# Patient Record
Sex: Female | Born: 1980 | Race: Black or African American | Hispanic: No | Marital: Single | Smoking: Never smoker
Health system: Southern US, Community
[De-identification: ages and names within clinical notes are randomized; demographics above are authoritative.]

## PROBLEM LIST (undated history)

## (undated) ENCOUNTER — Inpatient Hospital Stay (HOSPITAL_COMMUNITY): Payer: Self-pay

## (undated) DIAGNOSIS — N390 Urinary tract infection, site not specified: Secondary | ICD-10-CM

## (undated) DIAGNOSIS — D219 Benign neoplasm of connective and other soft tissue, unspecified: Secondary | ICD-10-CM

## (undated) DIAGNOSIS — R51 Headache: Secondary | ICD-10-CM

## (undated) DIAGNOSIS — D649 Anemia, unspecified: Secondary | ICD-10-CM

## (undated) HISTORY — DX: Headache: R51

## (undated) HISTORY — DX: Benign neoplasm of connective and other soft tissue, unspecified: D21.9

## (undated) HISTORY — DX: Anemia, unspecified: D64.9

## (undated) HISTORY — DX: Urinary tract infection, site not specified: N39.0

---

## 2011-03-24 DIAGNOSIS — N390 Urinary tract infection, site not specified: Secondary | ICD-10-CM

## 2011-03-24 HISTORY — DX: Urinary tract infection, site not specified: N39.0

## 2011-04-10 ENCOUNTER — Other Ambulatory Visit (HOSPITAL_COMMUNITY)
Admission: RE | Admit: 2011-04-10 | Discharge: 2011-04-10 | Disposition: A | Discharge status: Home / Self Care | Payer: Medicaid Other | Source: Ambulatory Visit | Attending: Obstetrics & Gynecology | Admitting: Obstetrics & Gynecology

## 2011-04-10 ENCOUNTER — Other Ambulatory Visit: Payer: Self-pay | Admitting: Obstetrics & Gynecology

## 2011-04-10 ENCOUNTER — Encounter (HOSPITAL_COMMUNITY): Payer: Self-pay | Admitting: Obstetrics & Gynecology

## 2011-04-10 DIAGNOSIS — Z1159 Encounter for screening for other viral diseases: Secondary | ICD-10-CM | POA: Insufficient documentation

## 2011-04-10 DIAGNOSIS — Z113 Encounter for screening for infections with a predominantly sexual mode of transmission: Secondary | ICD-10-CM | POA: Insufficient documentation

## 2011-04-10 DIAGNOSIS — Z124 Encounter for screening for malignant neoplasm of cervix: Secondary | ICD-10-CM | POA: Insufficient documentation

## 2011-04-13 ENCOUNTER — Encounter (HOSPITAL_COMMUNITY): Payer: Self-pay

## 2011-04-15 ENCOUNTER — Ambulatory Visit (HOSPITAL_COMMUNITY)
Admission: RE | Admit: 2011-04-15 | Discharge: 2011-04-15 | Disposition: A | Discharge status: Home / Self Care | Payer: Medicaid Other | Source: Ambulatory Visit | Attending: Obstetrics and Gynecology | Admitting: Obstetrics and Gynecology

## 2011-04-15 ENCOUNTER — Encounter (HOSPITAL_COMMUNITY): Payer: Self-pay | Admitting: Anesthesiology

## 2011-04-15 ENCOUNTER — Ambulatory Visit (HOSPITAL_COMMUNITY): Payer: Medicaid Other | Admitting: Anesthesiology

## 2011-04-15 ENCOUNTER — Encounter (HOSPITAL_COMMUNITY)
Admission: RE | Disposition: A | Discharge status: Home / Self Care | Payer: Self-pay | Source: Ambulatory Visit | Attending: Obstetrics and Gynecology

## 2011-04-15 ENCOUNTER — Other Ambulatory Visit: Payer: Self-pay | Admitting: Obstetrics & Gynecology

## 2011-04-15 ENCOUNTER — Other Ambulatory Visit: Payer: Self-pay | Admitting: Obstetrics and Gynecology

## 2011-04-15 DIAGNOSIS — O021 Missed abortion: Secondary | ICD-10-CM | POA: Insufficient documentation

## 2011-04-15 HISTORY — PX: DILATION AND EVACUATION: SHX1459

## 2011-04-15 LAB — CBC
MCHC: 32.2 g/dL (ref 30.0–36.0)
Platelets: 257 10*3/uL (ref 150–400)
RDW: 13.5 % (ref 11.5–15.5)
WBC: 6.3 10*3/uL (ref 4.0–10.5)

## 2011-04-15 SURGERY — DILATION AND EVACUATION, UTERUS
Anesthesia: General | Site: Uterus | Wound class: Clean Contaminated

## 2011-04-15 MED ORDER — SODIUM CHLORIDE 0.9 % IJ SOLN: 3.0000 mL | Freq: Two times a day (BID) | INTRAMUSCULAR | Status: DC

## 2011-04-15 MED ORDER — KETOROLAC TROMETHAMINE 30 MG/ML IJ SOLN
INTRAMUSCULAR | Status: AC
  Filled 2011-04-15: qty 1

## 2011-04-15 MED ORDER — MIDAZOLAM HCL 2 MG/2ML IJ SOLN
INTRAMUSCULAR | Status: AC
  Filled 2011-04-15: qty 2

## 2011-04-15 MED ORDER — FENTANYL CITRATE 0.05 MG/ML IJ SOLN
INTRAMUSCULAR | Status: DC | PRN
  Administered 2011-04-15: 25 ug via INTRAVENOUS
  Administered 2011-04-15: 50 ug via INTRAVENOUS
  Administered 2011-04-15: 25 ug via INTRAVENOUS

## 2011-04-15 MED ORDER — KETOROLAC TROMETHAMINE 30 MG/ML IJ SOLN: 15.0000 mg | Freq: Once | INTRAMUSCULAR | Status: DC | PRN

## 2011-04-15 MED ORDER — MISOPROSTOL 200 MCG PO TABS
ORAL_TABLET | ORAL | Status: AC
  Filled 2011-04-15: qty 1

## 2011-04-15 MED ORDER — SODIUM CHLORIDE 0.9 % IJ SOLN: 3.0000 mL | INTRAMUSCULAR | Status: DC | PRN

## 2011-04-15 MED ORDER — KETOROLAC TROMETHAMINE 30 MG/ML IJ SOLN
INTRAMUSCULAR | Status: DC | PRN
  Administered 2011-04-15: 30 mg via INTRAVENOUS

## 2011-04-15 MED ORDER — LIDOCAINE HCL (CARDIAC) 20 MG/ML IV SOLN
INTRAVENOUS | Status: AC
  Filled 2011-04-15: qty 5

## 2011-04-15 MED ORDER — MEPERIDINE HCL 25 MG/ML IJ SOLN
6.2500 mg | INTRAMUSCULAR | Status: DC | PRN
Start: 2011-04-15 — End: 2011-04-15

## 2011-04-15 MED ORDER — FENTANYL CITRATE 0.05 MG/ML IJ SOLN
INTRAMUSCULAR | Status: AC
  Filled 2011-04-15: qty 2

## 2011-04-15 MED ORDER — LIDOCAINE HCL (CARDIAC) 20 MG/ML IV SOLN
INTRAVENOUS | Status: DC | PRN
  Administered 2011-04-15: 40 mg via INTRAVENOUS

## 2011-04-15 MED ORDER — LACTATED RINGERS IV SOLN
INTRAVENOUS | Status: DC
  Administered 2011-04-15 (×2): via INTRAVENOUS

## 2011-04-15 MED ORDER — PROMETHAZINE HCL 25 MG/ML IJ SOLN: 6.2500 mg | INTRAMUSCULAR | Status: DC | PRN

## 2011-04-15 MED ORDER — HYDROCODONE-ACETAMINOPHEN 5-325 MG PO TABS: 1.0000 | ORAL_TABLET | ORAL | Status: DC | PRN

## 2011-04-15 MED ORDER — ONDANSETRON HCL 4 MG/2ML IJ SOLN
INTRAMUSCULAR | Status: DC | PRN
  Administered 2011-04-15: 4 mg via INTRAVENOUS

## 2011-04-15 MED ORDER — ONDANSETRON HCL 4 MG/2ML IJ SOLN
INTRAMUSCULAR | Status: AC
  Filled 2011-04-15: qty 2

## 2011-04-15 MED ORDER — SODIUM CHLORIDE 0.9 % IV SOLN: 250.0000 mL | INTRAVENOUS | Status: DC | PRN

## 2011-04-15 MED ORDER — EPHEDRINE SULFATE 50 MG/ML IJ SOLN
INTRAMUSCULAR | Status: DC | PRN
  Administered 2011-04-15 (×2): 5 mg via INTRAVENOUS

## 2011-04-15 MED ORDER — LIDOCAINE HCL 2 % IJ SOLN
INTRAMUSCULAR | Status: AC
  Filled 2011-04-15: qty 1

## 2011-04-15 MED ORDER — MIDAZOLAM HCL 5 MG/5ML IJ SOLN
INTRAMUSCULAR | Status: DC | PRN
  Administered 2011-04-15: 2 mg via INTRAVENOUS

## 2011-04-15 MED ORDER — PROPOFOL 10 MG/ML IV EMUL
INTRAVENOUS | Status: DC | PRN
  Administered 2011-04-15: 140 mg via INTRAVENOUS

## 2011-04-15 MED ORDER — FENTANYL CITRATE 0.05 MG/ML IJ SOLN: 25.0000 ug | INTRAMUSCULAR | Status: DC | PRN

## 2011-04-15 MED ORDER — SILVER NITRATE-POT NITRATE 75-25 % EX MISC
CUTANEOUS | Status: AC
  Filled 2011-04-15: qty 1

## 2011-04-15 MED ORDER — PROPOFOL 10 MG/ML IV EMUL
INTRAVENOUS | Status: AC
  Filled 2011-04-15: qty 20

## 2011-04-15 SURGICAL SUPPLY — 17 items
CATH ROBINSON RED A/P 16FR (CATHETERS) IMPLANT
CLOTH BEACON ORANGE TIMEOUT ST (SAFETY) ×2 IMPLANT
DECANTER SPIKE VIAL GLASS SM (MISCELLANEOUS) ×2 IMPLANT
GLOVE BIO SURGEON STRL SZ7.5 (GLOVE) ×2 IMPLANT
GLOVE BIOGEL PI IND STRL 7.5 (GLOVE) ×1 IMPLANT
GLOVE BIOGEL PI INDICATOR 7.5 (GLOVE) ×1
GOWN PREVENTION PLUS LG XLONG (DISPOSABLE) ×2 IMPLANT
GOWN PREVENTION PLUS XLARGE (GOWN DISPOSABLE) ×2 IMPLANT
KIT BERKELEY 1ST TRIMESTER 3/8 (MISCELLANEOUS) ×2 IMPLANT
NEEDLE SPNL 22GX3.5 QUINCKE BK (NEEDLE) ×2 IMPLANT
NS IRRIG 1000ML POUR BTL (IV SOLUTION) ×2 IMPLANT
PACK VAGINAL MINOR WOMEN LF (CUSTOM PROCEDURE TRAY) ×2 IMPLANT
PAD PREP 24X48 CUFFED NSTRL (MISCELLANEOUS) ×2 IMPLANT
SET BERKELEY SUCTION TUBING (SUCTIONS) ×2 IMPLANT
SYR CONTROL 10ML LL (SYRINGE) ×2 IMPLANT
TOWEL OR 17X24 6PK STRL BLUE (TOWEL DISPOSABLE) ×4 IMPLANT
VACURETTE 10 RIGID CVD (CANNULA) ×2 IMPLANT

## 2011-04-15 NOTE — Transfer of Care (Signed)
Immediate Anesthesia Transfer of Care Note  Patient: Sherri Galvan  Procedure(s) Performed:  DILATATION AND EVACUATION - 12wks: Performed by Dr. Filbert Berthold  Patient Location: PACU  Anesthesia Type: General  Level of Consciousness: awake, alert  and oriented  Airway & Oxygen Therapy: Patient Spontanous Breathing and Patient connected to nasal cannula oxygen  Post-op Assessment: Report given to PACU RN and Post -op Vital signs reviewed and stable  Post vital signs: Reviewed and stable  Complications: No apparent anesthesia complications

## 2011-04-15 NOTE — Op Note (Addendum)
Sherri Galvan PROCEDURE DATE: 04/15/2011  PREOPERATIVE DIAGNOSIS: 12 week missed abortion. POSTOPERATIVE DIAGNOSIS: The same. PROCEDURE:     Dilation and Evacuation. SURGEON:  Dr. Waverly Ferrari. [name]  INDICATIONS: 31 y.o. G4P0013with MAB at **1* weeks gestation, needing surgical completion.  Risks of surgery were discussed with the patient including but not limited to: bleeding which may require transfusion; infection which may require antibiotics; injury to uterus or surrounding organs;need for additional procedures including laparotomy or laparoscopy; possibility of intrauterine scarring which may impair future fertility; and other postoperative/anesthesia complications. Written informed consent was obtained.    FINDINGS:  A 12 size anteverted/midline/retroverted uterus, moderate amounts of products of conception, specimen sent to pathology.  ANESTHESIA:    Monitored intravenous sedation, paracervical block. INTRAVENOUS FLUIDS:  1200 ml of LR ESTIMATED BLOOD LOSS:  Less than 150 ml. SPECIMENS:  Products of conception sent to pathology COMPLICATIONS:  None immediate.  PROCEDURE DETAILS:  The patient and significant other [name] were met in the short stay area.  She was then taken to the operating room where general anesthesia was administered and was found to be adequate.  After an adequate timeout was performed, she was placed in the dorsal lithotomy position and examined; then prepped and draped in the sterile manner.   A vaginal speculum was then placed in the patient's vagina and a single tooth tenaculum was applied to the anterior lip of the cervix.  The cervix was gently dilated to to a 31 [place] dilator.  A size 10 suction curette that was gently advanced to the uterine fundus after confirming that the the maximum of negative pressure was 60 cm mercury.  The suction device was then activated and curette slowly rotated to clear the uterus of products of conception.  A sharp curettage was then  performed to confirm complete emptying of the uterus. There was minimal bleeding noted.  The suction device was reintroduced and in a similar fashion and the tenaculum removed with good hemostasis noted.   All instruments were removed from the patient's vagina. The patient tolerated the procedure well and was taken to the recovery area awake, and in stable condition.  The patient will be discharged to home as per PACU criteria.  Routine postoperative instructions given.  She was prescribed , Ibuprofen.  She will follow up in the clinic on in 2 weeks for postoperative evaluation.

## 2011-04-15 NOTE — Anesthesia Postprocedure Evaluation (Signed)
Anesthesia Post Note  Patient: Sherri Galvan  Procedure(s) Performed:  DILATATION AND EVACUATION - 12wks: Performed by Dr. Filbert Berthold  Anesthesia type: General  Patient location: PACU  Post pain: Pain level controlled  Post assessment: Post-op Vital signs reviewed  Last Vitals:  Filed Vitals:   04/15/11 1046  BP: 96/64  Pulse: 65  Temp: 36.9 C  Resp: 16    Post vital signs: Reviewed  Level of consciousness: sedated  Complications: No apparent anesthesia complicationsfj

## 2011-04-15 NOTE — H&P (Addendum)
Sherri Galvan is an 31 y.o. female. [id number] with the current missed ab ABO Bpositive with an [place] at [facility] with an IUP on 10JAN13 with CRL consistent with [redacted]w[redacted]d and presented to the office with one day h/o pelvic cramping and no fetal doptones were discovered and the Korea confirmed no fetal cardiac activity at 12w1 by CRL.  Myomas were noted on both [place] with the largest reportedly 6cm.   Pertinent Gynecological History: Menses: flow is moderate and LMP was 22OCT12 Bleeding: none Contraception: none DES exposure: denies Blood transfusions: none Sexually transmitted diseases: no past history Previous GYN Procedures: none  Last mammogram: none Date: none Last pap: 4/09 normal pap pending Date: 4/09 OB History: G4, P3013   Menstrual History: Menarche age: wnl No LMP recorded.[id number]    Past Medical History  Diagnosis Date  . UTI (lower urinary tract infection) 03-24-11    finishing macrobid  . Headache   . Fibroid     No past surgical history on file.none  Family History  Problem Relation Age of Onset  . Breast cancer Maternal Grandmother     maternal grandmother  . Cancer Maternal Grandmother     Social History:  reports that she has never smoked. She does not have any smokeless tobacco history on file. She reports that she does not drink alcohol or use illicit drugs.  Allergies: No Known Allergies  No prescriptions prior to admission    Review of Systems  Constitutional: Negative.   HENT: Negative for ear pain, congestion, sore throat, neck pain and tinnitus.   Eyes: Negative for blurred vision.  Respiratory: Negative for cough, shortness of breath and wheezing.   Cardiovascular: Negative for chest pain.  Gastrointestinal: Positive for constipation. Negative for heartburn, nausea, vomiting, diarrhea and blood in stool.  Genitourinary: Negative for dysuria and hematuria.  Musculoskeletal: Negative for joint pain.  Skin: Negative.   Neurological: Positive for headaches.  Negative for dizziness.  Endo/Heme/Allergies: Negative.   Psychiatric/Behavioral: Negative for depression.  admits to HA and constipation and some pelvic cramping  Denies nausea and emesis fever chill dysuria and diarrhea hematuria and hematochezia chest pain or shortness of breath cough hoint pain or tinnitus  unknown if currently breastfeeding. Physical Exam  Constitutional: She is oriented to person, place, and time. She appears well-developed and well-nourished.  HENT:  Head: Normocephalic and atraumatic.  Mouth/Throat: Oropharynx is clear and moist.  Eyes: Pupils are equal, round, and reactive to light.  Neck: Normal range of motion. Neck supple. No thyromegaly present.  Cardiovascular: Normal rate and normal heart sounds.   Respiratory: Effort normal and breath sounds normal. She has no wheezes. She has no rales.  GI: Soft. She exhibits no distension. There is no tenderness. There is no rebound and no guarding.  Genitourinary: Vagina normal.  Musculoskeletal: Normal range of motion. She exhibits no edema.  Neurological: She is alert and oriented to person, place, and time.  Skin: Skin is warm.  Psychiatric: She has a normal mood and affect. Her behavior is normal.   VSSAF Gerneral: Well appearing female in NAD HEENT: NCAT with PERRL with OP clear of lesions and no Lymph nodes with full ROM Chest CTAB CV RRR ABD Nontender with midline fundus noted just above symphysis EXT non tender full ROM and normal strength  GYN: EXT genitalia WNL Vagina wnl with slight mucoid discharge Cervix without lesions parous Uterus about12-14 week size Adnexa without masses or tenderness    No results found for this or any previous  visit (from the past 24 hour(s)).  No results found.  Assessment/Plan: Missed abat 12 weeks B positive Fibroids UTI treated macrobid completed Constipation     Suction D&C   And do not anticipate the need for blood products  Elbony Mcclimans  H. 04/15/2011, 9:37 AM

## 2011-04-15 NOTE — OR Nursing (Signed)
Case scheduled under Dr. Neva Seat, partner Dr. Filbert Berthold performed the procedure.  Dr. Christell Constant currently not in the [facility] system for charging purposes.

## 2011-04-15 NOTE — Anesthesia Preprocedure Evaluation (Signed)
Anesthesia Evaluation  Patient identified by MRN, date of birth, ID band Patient awake    Reviewed: Allergy & Precautions, H&P , NPO status , Patient's Chart, lab work & pertinent test results  Airway Mallampati: I TM Distance: >3 FB Neck ROM: full    Dental No notable dental hx.    Pulmonary neg pulmonary ROS,    Pulmonary exam normal       Cardiovascular neg cardio ROS     Neuro/Psych Negative Psych ROS   GI/Hepatic negative GI ROS, Neg liver ROS,   Endo/Other  Negative Endocrine ROS  Renal/GU negative Renal ROS  Genitourinary negative   Musculoskeletal negative musculoskeletal ROS (+)   Abdominal Normal abdominal exam  (+)   Peds negative pediatric ROS (+)  Hematology negative hematology ROS (+)   Anesthesia Other Findings   Reproductive/Obstetrics (+) Pregnancy 12 week missed Ab                           Anesthesia Physical Anesthesia Plan  ASA: II  Anesthesia Plan: General   Post-op Pain Management:    Induction: Intravenous  Airway Management Planned: LMA  Additional Equipment:   Intra-op Plan:   Post-operative Plan:   Informed Consent: I have reviewed the patients History and Physical, chart, labs and discussed the procedure including the risks, benefits and alternatives for the proposed anesthesia with the patient or authorized representative who has indicated his/her understanding and acceptance.     Plan Discussed with: CRNA  Anesthesia Plan Comments:         Anesthesia Quick Evaluation

## 2011-04-16 ENCOUNTER — Encounter (HOSPITAL_COMMUNITY): Payer: Self-pay | Admitting: Obstetrics and Gynecology

## 2011-06-29 ENCOUNTER — Inpatient Hospital Stay (HOSPITAL_COMMUNITY)
Admission: AD | Admit: 2011-06-29 | Discharge: 2011-06-30 | Disposition: A | Discharge status: Home / Self Care | Payer: Medicaid Other | Source: Ambulatory Visit | Attending: Obstetrics and Gynecology | Admitting: Obstetrics and Gynecology

## 2011-06-29 ENCOUNTER — Encounter (HOSPITAL_COMMUNITY): Payer: Self-pay | Admitting: *Deleted

## 2011-06-29 DIAGNOSIS — O021 Missed abortion: Secondary | ICD-10-CM | POA: Insufficient documentation

## 2011-06-29 DIAGNOSIS — IMO0002 Reserved for concepts with insufficient information to code with codable children: Secondary | ICD-10-CM

## 2011-06-29 NOTE — MAU Note (Signed)
Patient was transferred from [place] for further evaluation.

## 2011-06-29 NOTE — MAU Note (Signed)
Vaginal bleeding since 2/21 after D&C for miscarriage. The following Friday five days later patient states she passed a specimen that looked like a baby with eyes. The bleeding has continued and gotten heavier. Abdominal cramping since 4/15 after what the patient states was her menstrual cycle.

## 2011-06-30 MED ORDER — IBUPROFEN 600 MG PO TABS
600.0000 mg | ORAL_TABLET | Freq: Once | ORAL | Status: AC
  Administered 2011-06-30: 600 mg via ORAL
  Filled 2011-06-30: qty 1

## 2011-06-30 MED ORDER — METHYLERGONOVINE MALEATE 0.2 MG PO TABS
0.2000 mg | ORAL_TABLET | Freq: Once | ORAL | Status: AC
  Administered 2011-06-30: 0.2 mg via ORAL
  Filled 2011-06-30: qty 1

## 2011-06-30 MED ORDER — METHYLERGONOVINE MALEATE 0.2 MG PO TABS: 0.2000 mg | ORAL_TABLET | Freq: Four times a day (QID) | ORAL | Status: DC

## 2011-06-30 MED ORDER — IBUPROFEN 600 MG PO TABS: 600.0000 mg | ORAL_TABLET | Freq: Once | ORAL | Status: AC

## 2011-06-30 NOTE — MAU Provider Note (Cosign Needed)
History   HPI: Sherri Galvan is a 31 y.o. year old G6P0013 female 2 1/2 months S/P D&E for missed abortion who was transported to MAU by EMS from [facility] for severe abdominal pain and bleeding x 2 1/2 months. She had an extensive workup including CBC (WBCs 12.6, Hgb 11.5) and US showing enlarged leiomyomatosis uterus w/ debris and hemorrhage  Within endometrium. There is no obvious retained POC. Pt's pain resolved w/ Morphine. She reports moderate bleeding while at [facility]. She    CSN: [id number]  Arrival date and time: 06/29/11 2239   First Provider Initiated Contact with Patient 06/30/11 0038      Chief Complaint  Patient presents with  . Abdominal Cramping   HPI  OB History    Grav Para Term Preterm Abortions TAB SAB Ect Mult Living   4 3   1     3       Past Medical History  Diagnosis Date  . UTI (lower urinary tract infection) 03-24-11    finishing macrobid  . Headache   . Fibroid     Past Surgical History  Procedure Date  . Dilation and evacuation 04/15/2011    Procedure: DILATATION AND EVACUATION;  Surgeon: Fortino Sic, MD;  Location: [facility];  Service: Gynecology;  Laterality: N/A;  12wks: Performed by Dr. Filbert Berthold    Family History  Problem Relation Age of Onset  . Breast cancer Maternal Grandmother     maternal grandmother  . Cancer Maternal Grandmother     History  Substance Use Topics  . Smoking status: Never Smoker   . Smokeless tobacco: Not on file  . Alcohol Use: No    Allergies: No Known Allergies  Prescriptions prior to admission  Medication Sig Dispense Refill  . acetaminophen (TYLENOL) 500 MG tablet Take 500 mg by mouth every 6 (six) hours as needed. For pain        Review of Systems  Constitutional: Negative for fever and chills.  Gastrointestinal: Positive for nausea and abdominal pain. Negative for vomiting, diarrhea and constipation.  Genitourinary: Negative for dysuria, urgency, frequency, hematuria and flank pain.    Musculoskeletal: Negative for myalgias.  Neurological: Negative for weakness.   Physical Exam  BP 111/74  Pulse 64  Resp 18  LMP 06/17/2011  Breastfeeding? No  Physical Exam  Nursing note and vitals reviewed. Constitutional: She is oriented to person, place, and time. She appears well-developed and well-nourished. No distress.  Cardiovascular: Normal rate.   Respiratory: Effort normal.  GI: Soft. She exhibits no distension and no mass. There is tenderness (mild low abd). There is no rebound and no guarding.  Genitourinary: Uterus is enlarged. Uterus is not tender. Right adnexum displays no mass, no tenderness and no fullness. Left adnexum displays no mass, no tenderness and no fullness. There is bleeding (moderate amount of milky, mildly malodorous serosanguinous discharge in vault. ) around the vagina. No tenderness around the vagina.       Large dark red mass visible at os during spec exam. Removed what appears to be an intact 5x8 cm placenta w/ ring forceps. Pt tolerated well. Moderate gush of bloody discharge immediately after removal of POC, then small amount to follow. Cervix 1-2 cm dilated. Uterus enlarged to 10-11 weeks-size, irreg shape. NT.  Neurological: She is alert and oriented to person, place, and time.  Skin: Skin is warm and dry.  Psychiatric: She has a normal mood and affect.    MAU Course  Procedures  Dr. Richardson Dopp notified of findings. Gave orders to send POC to pathology and to start Methergine series.   Assessment and Plan   1. Retained products of conception     Plan:  D/C home Follow-up Information    Follow up with Fortino Sic, MD in 1 week. (or MAU as needed if symptoms worsen)    Contact information:   [address] [place] [address] [phone]         Medication List  As of 06/30/2011  1:03 AM   START taking these medications         ibuprofen 600 MG tablet   Commonly known as: ADVIL,MOTRIN   Take 1 tablet (600 mg  total) by mouth once.      methylergonovine 0.2 MG tablet   Commonly known as: METHERGINE   Take 1 tablet (0.2 mg total) by mouth every 6 (six) hours.         CONTINUE taking these medications         acetaminophen 500 MG tablet   Commonly known as: TYLENOL          Where to get your medications    These are the prescriptions that you need to pick up.   You may get these medications from any pharmacy.         ibuprofen 600 MG tablet   methylergonovine 0.2 MG tablet          Bleeding and infection precautions  Deaven Urwin 06/30/2011, 12:37 AM

## 2011-06-30 NOTE — MAU Note (Signed)
Products collected during pelvic exam sent to Pathology.

## 2011-10-29 LAB — OB RESULTS CONSOLE ABO/RH: RH Type: POSITIVE

## 2011-10-29 LAB — OB RESULTS CONSOLE ANTIBODY SCREEN: Antibody Screen: NEGATIVE

## 2011-10-29 LAB — OB RESULTS CONSOLE HEPATITIS B SURFACE ANTIGEN: Hepatitis B Surface Ag: NEGATIVE

## 2012-02-21 LAB — OB RESULTS CONSOLE HIV ANTIBODY (ROUTINE TESTING): HIV: NONREACTIVE

## 2012-04-28 LAB — OB RESULTS CONSOLE GBS: GBS: NEGATIVE

## 2012-06-02 ENCOUNTER — Other Ambulatory Visit: Payer: Self-pay | Admitting: Obstetrics & Gynecology

## 2012-06-02 NOTE — H&P (Signed)
Sherri Galvan is a 32 y.o. female presenting for post term induction. Maternal Medical History:  Reason for admission: Nausea. 32 yo G5 P3013 at 41w3 on 06-04-12 after EDC of 05-25-12 with 4022g fetus at 86% with BPP of 8/8 on 06-02-12.  She is now being admitted for induction of labor.  She desires a CLE and does not desire elective CSx and understands the increase risk of CSx with induction of labor.  She desires circumcision and also BTL.  She is GBBS negative.    Her pregnancy is complicated by fibroids 6 cm and 4 cm and above the cervix and LUS.    Fetal activity: Perceived fetal activity is normal.    Prenatal complications: no prenatal complications No bleeding, cholelithiasis, HIV, hypertension, infection, IUGR, nephrolithiasis, oligohydramnios, placental abnormality, polyhydramnios, pre-eclampsia, preterm labor, substance abuse, thrombocytopenia or thrombophilia.   Prenatal Complications - Diabetes: none.    OB History   Grav Para Term Preterm Abortions TAB SAB Ect Mult Living   4 3   1     3      Past Medical History  Diagnosis Date  . UTI (lower urinary tract infection) 03-24-11    finishing macrobid  . Headache   . Fibroid    Past Surgical History  Procedure Laterality Date  . Dilation and evacuation  04/15/2011    Procedure: DILATATION AND EVACUATION;  Surgeon: Fortino Sic, MD;  Location: [facility];  Service: Gynecology;  Laterality: N/A;  12wks: Performed by Dr. Filbert Berthold   Family History: family history includes Breast cancer in her maternal grandmother and Cancer in her maternal grandmother. Social History:  reports that she has never smoked. She does not have any smokeless tobacco history on file. She reports that she does not drink alcohol or use illicit drugs.   Prenatal Transfer Tool  Maternal Diabetes: No Genetic Screening: Normal NT/Early screen negative and AFP negative Maternal Ultrasounds/Referrals: Normal Fetal Ultrasounds or other Referrals:   None Maternal Substance Abuse:  No Significant Maternal Medications:  None Significant Maternal Lab Results:  None Other Comments:  None  Review of Systems  Constitutional: Negative.   HENT: Negative.  Negative for hearing loss, ear pain, sore throat and tinnitus.   Eyes: Negative.  Negative for blurred vision, double vision, photophobia and discharge.  Respiratory: Negative.  Negative for cough, sputum production, shortness of breath and wheezing.   Cardiovascular: Negative.  Negative for chest pain, palpitations, orthopnea and leg swelling.  Gastrointestinal: Negative.  Negative for nausea, vomiting, diarrhea and constipation.  Genitourinary: Negative.  Negative for dysuria, hematuria and flank pain.  Musculoskeletal: Negative.  Negative for myalgias.  Skin: Negative.  Negative for rash.  Neurological: Negative.  Negative for dizziness, tremors, speech change, focal weakness, seizures, loss of consciousness and headaches.  Endo/Heme/Allergies: Negative.  Does not bruise/bleed easily.  Psychiatric/Behavioral: Negative.  Negative for depression, suicidal ideas, hallucinations and substance abuse. The patient is not nervous/anxious.       not currently breastfeeding. Maternal Exam:  Abdomen: Patient reports no abdominal tenderness. Estimated fetal weight is 8#14.   Fetal presentation: vertex  Introitus: Normal vagina.  Evaluated at last visit  Pelvis: adequate for delivery.   Cervix: Cervix evaluated by digital exam.   05-29-12  Physical Exam  Constitutional: She is oriented to person, place, and time. She appears well-developed and well-nourished.  HENT:  Head: Normocephalic and atraumatic.  Right Ear: External ear normal.  Left Ear: External ear normal.  Mouth/Throat: No oropharyngeal exudate.  Eyes: EOM are  normal. Pupils are equal, round, and reactive to light. Right eye exhibits no discharge. Left eye exhibits no discharge.  Neck: Normal range of motion. Neck supple. No  tracheal deviation present. No thyromegaly present.  Cardiovascular: Normal rate, regular rhythm and normal heart sounds.   Respiratory: Effort normal and breath sounds normal. She has no wheezes.  GI: Soft.  Gravid non tender  Musculoskeletal: Normal range of motion. She exhibits no edema and no tenderness.  Lymphadenopathy:    She has no cervical adenopathy.  Neurological: She is alert and oriented to person, place, and time. No cranial nerve deficit.  Skin: Skin is warm and dry. No rash noted.  Psychiatric: She has a normal mood and affect. Her behavior is normal. Judgment and thought content normal.    Prenatal labs: ABO, Rh:  Bpos Antibody:  negative Rubella:  immune RPR:   nonreactive HBsAg:   negative HIV:   nonreactive GBS:   negative HGB AA Assessment/Plan: [redacted]w[redacted]d  Macrosomia Post term Fibroids Anemia mild Desires sterilization Desires circumcision Desires CLE   Admit for induction of labor, pitocin, proven pelvis  Syd Manges H. 06/02/2012, 3:26 PM

## 2012-06-03 ENCOUNTER — Encounter (HOSPITAL_COMMUNITY): Payer: Self-pay | Admitting: *Deleted

## 2012-06-03 ENCOUNTER — Telehealth (HOSPITAL_COMMUNITY): Payer: Self-pay | Admitting: *Deleted

## 2012-06-03 NOTE — Telephone Encounter (Signed)
Preadmission screen  

## 2012-06-04 ENCOUNTER — Encounter (HOSPITAL_COMMUNITY): Payer: Self-pay

## 2012-06-04 ENCOUNTER — Inpatient Hospital Stay (HOSPITAL_COMMUNITY): Payer: Medicaid Other | Admitting: Anesthesiology

## 2012-06-04 ENCOUNTER — Encounter (HOSPITAL_COMMUNITY)
Admission: RE | Disposition: A | Discharge status: Home / Self Care | Payer: Self-pay | Source: Ambulatory Visit | Attending: Obstetrics & Gynecology

## 2012-06-04 ENCOUNTER — Inpatient Hospital Stay (HOSPITAL_COMMUNITY)
Admission: RE | Admit: 2012-06-04 | Discharge: 2012-06-07 | DRG: 765 | Disposition: A | Discharge status: Home / Self Care | Payer: Medicaid Other | Source: Ambulatory Visit | Attending: Obstetrics & Gynecology | Admitting: Obstetrics & Gynecology

## 2012-06-04 ENCOUNTER — Encounter (HOSPITAL_COMMUNITY): Payer: Self-pay | Admitting: Anesthesiology

## 2012-06-04 DIAGNOSIS — O480 Post-term pregnancy: Secondary | ICD-10-CM | POA: Diagnosis present

## 2012-06-04 DIAGNOSIS — D259 Leiomyoma of uterus, unspecified: Secondary | ICD-10-CM | POA: Diagnosis present

## 2012-06-04 DIAGNOSIS — O34599 Maternal care for other abnormalities of gravid uterus, unspecified trimester: Secondary | ICD-10-CM | POA: Diagnosis present

## 2012-06-04 DIAGNOSIS — D4959 Neoplasm of unspecified behavior of other genitourinary organ: Secondary | ICD-10-CM | POA: Diagnosis present

## 2012-06-04 DIAGNOSIS — O9903 Anemia complicating the puerperium: Secondary | ICD-10-CM | POA: Diagnosis not present

## 2012-06-04 DIAGNOSIS — Z302 Encounter for sterilization: Secondary | ICD-10-CM

## 2012-06-04 DIAGNOSIS — O3660X0 Maternal care for excessive fetal growth, unspecified trimester, not applicable or unspecified: Secondary | ICD-10-CM | POA: Diagnosis present

## 2012-06-04 DIAGNOSIS — D62 Acute posthemorrhagic anemia: Secondary | ICD-10-CM | POA: Diagnosis not present

## 2012-06-04 HISTORY — PX: TUBAL LIGATION: SHX77

## 2012-06-04 LAB — CBC
Hemoglobin: 10.1 g/dL — ABNORMAL LOW (ref 12.0–15.0)
MCH: 33.6 pg (ref 26.0–34.0)
MCV: 96.1 fL (ref 78.0–100.0)
MCV: 98 fL (ref 78.0–100.0)
Platelets: 180 10*3/uL (ref 150–400)
Platelets: 162 10*3/uL (ref 150–400)
RBC: 3.61 MIL/uL — ABNORMAL LOW (ref 3.87–5.11)
RBC: 3.01 MIL/uL — ABNORMAL LOW (ref 3.87–5.11)
RDW: 12.4 % (ref 11.5–15.5)
WBC: 7.7 10*3/uL (ref 4.0–10.5)
WBC: 24.7 10*3/uL — ABNORMAL HIGH (ref 4.0–10.5)

## 2012-06-04 LAB — PREPARE RBC (CROSSMATCH)

## 2012-06-04 LAB — ABO/RH: ABO/RH(D): B POS

## 2012-06-04 SURGERY — Surgical Case
Anesthesia: Epidural | Site: Abdomen

## 2012-06-04 MED ORDER — MORPHINE SULFATE 0.5 MG/ML IJ SOLN
INTRAMUSCULAR | Status: AC
  Filled 2012-06-04: qty 10

## 2012-06-04 MED ORDER — HYDROMORPHONE HCL PF 1 MG/ML IJ SOLN: 0.2500 mg | INTRAMUSCULAR | Status: DC | PRN

## 2012-06-04 MED ORDER — MEPERIDINE HCL 25 MG/ML IJ SOLN
6.2500 mg | INTRAMUSCULAR | Status: DC | PRN
  Administered 2012-06-04: 6.25 mg via INTRAVENOUS

## 2012-06-04 MED ORDER — FENTANYL CITRATE 0.05 MG/ML IJ SOLN
INTRAMUSCULAR | Status: DC | PRN
  Administered 2012-06-04 (×2): 50 ug via INTRAVENOUS

## 2012-06-04 MED ORDER — EPHEDRINE 5 MG/ML INJ
10.0000 mg | INTRAVENOUS | Status: DC | PRN
  Filled 2012-06-04: qty 4

## 2012-06-04 MED ORDER — MEPERIDINE HCL 25 MG/ML IJ SOLN
INTRAMUSCULAR | Status: DC | PRN
  Administered 2012-06-04 (×2): 12.5 mg via INTRAVENOUS

## 2012-06-04 MED ORDER — SODIUM BICARBONATE 8.4 % IV SOLN
INTRAVENOUS | Status: AC
  Filled 2012-06-04: qty 50

## 2012-06-04 MED ORDER — LACTATED RINGERS IV SOLN: 500.0000 mL | Freq: Once | INTRAVENOUS | Status: DC

## 2012-06-04 MED ORDER — MORPHINE SULFATE (PF) 0.5 MG/ML IJ SOLN
INTRAMUSCULAR | Status: DC | PRN
  Administered 2012-06-04: 4 mg via EPIDURAL

## 2012-06-04 MED ORDER — DIPHENHYDRAMINE HCL 50 MG/ML IJ SOLN: 12.5000 mg | INTRAMUSCULAR | Status: DC | PRN

## 2012-06-04 MED ORDER — BUTORPHANOL TARTRATE 1 MG/ML IJ SOLN: 1.0000 mg | INTRAMUSCULAR | Status: DC | PRN

## 2012-06-04 MED ORDER — ONDANSETRON HCL 4 MG/2ML IJ SOLN: 4.0000 mg | Freq: Four times a day (QID) | INTRAMUSCULAR | Status: DC | PRN

## 2012-06-04 MED ORDER — LIDOCAINE HCL (PF) 1 % IJ SOLN
INTRAMUSCULAR | Status: DC | PRN
  Administered 2012-06-04: 8 mL
  Administered 2012-06-04: 9 mL

## 2012-06-04 MED ORDER — PHENYLEPHRINE 40 MCG/ML (10ML) SYRINGE FOR IV PUSH (FOR BLOOD PRESSURE SUPPORT): 80.0000 ug | PREFILLED_SYRINGE | INTRAVENOUS | Status: DC | PRN

## 2012-06-04 MED ORDER — ONDANSETRON HCL 4 MG/2ML IJ SOLN
INTRAMUSCULAR | Status: AC
  Filled 2012-06-04: qty 2

## 2012-06-04 MED ORDER — LACTATED RINGERS IV SOLN
INTRAVENOUS | Status: DC
  Administered 2012-06-04: 21:00:00 via INTRAVENOUS
  Administered 2012-06-04 (×2): 1000 mL via INTRAVENOUS
  Administered 2012-06-04: 21:00:00 via INTRAVENOUS

## 2012-06-04 MED ORDER — FENTANYL 2.5 MCG/ML BUPIVACAINE 1/10 % EPIDURAL INFUSION (WH - ANES)
INTRAMUSCULAR | Status: DC | PRN
  Administered 2012-06-04: 14 mL/h via EPIDURAL

## 2012-06-04 MED ORDER — ACETAMINOPHEN 325 MG PO TABS: 650.0000 mg | ORAL_TABLET | ORAL | Status: DC | PRN

## 2012-06-04 MED ORDER — CEFAZOLIN SODIUM-DEXTROSE 2-3 GM-% IV SOLR
INTRAVENOUS | Status: AC
  Filled 2012-06-04: qty 50

## 2012-06-04 MED ORDER — SCOPOLAMINE 1 MG/3DAYS TD PT72: 1.0000 | MEDICATED_PATCH | Freq: Once | TRANSDERMAL | Status: DC

## 2012-06-04 MED ORDER — CEFAZOLIN SODIUM-DEXTROSE 2-3 GM-% IV SOLR
INTRAVENOUS | Status: DC | PRN
  Administered 2012-06-04: 2 g via INTRAVENOUS

## 2012-06-04 MED ORDER — OXYTOCIN BOLUS FROM INFUSION: 500.0000 mL | INTRAVENOUS | Status: DC

## 2012-06-04 MED ORDER — LACTATED RINGERS IV SOLN
INTRAVENOUS | Status: DC
  Administered 2012-06-04: 19:00:00 via INTRAUTERINE

## 2012-06-04 MED ORDER — CITRIC ACID-SODIUM CITRATE 334-500 MG/5ML PO SOLN
30.0000 mL | ORAL | Status: DC | PRN
  Administered 2012-06-04: 30 mL via ORAL
  Filled 2012-06-04: qty 15

## 2012-06-04 MED ORDER — OXYTOCIN 40 UNITS IN LACTATED RINGERS INFUSION - SIMPLE MED: 62.5000 mL/h | INTRAVENOUS | Status: DC

## 2012-06-04 MED ORDER — PHENYLEPHRINE 40 MCG/ML (10ML) SYRINGE FOR IV PUSH (FOR BLOOD PRESSURE SUPPORT)
PREFILLED_SYRINGE | INTRAVENOUS | Status: AC
  Filled 2012-06-04: qty 5

## 2012-06-04 MED ORDER — EPHEDRINE 5 MG/ML INJ
INTRAVENOUS | Status: AC
  Filled 2012-06-04: qty 10

## 2012-06-04 MED ORDER — FENTANYL 2.5 MCG/ML BUPIVACAINE 1/10 % EPIDURAL INFUSION (WH - ANES)
14.0000 mL/h | INTRAMUSCULAR | Status: DC | PRN
  Administered 2012-06-04: 14 mL/h via EPIDURAL
  Filled 2012-06-04 (×2): qty 125

## 2012-06-04 MED ORDER — PHENYLEPHRINE 40 MCG/ML (10ML) SYRINGE FOR IV PUSH (FOR BLOOD PRESSURE SUPPORT)
80.0000 ug | PREFILLED_SYRINGE | INTRAVENOUS | Status: DC | PRN
  Filled 2012-06-04: qty 5

## 2012-06-04 MED ORDER — MEPERIDINE HCL 25 MG/ML IJ SOLN
INTRAMUSCULAR | Status: AC
  Filled 2012-06-04: qty 1

## 2012-06-04 MED ORDER — SCOPOLAMINE 1 MG/3DAYS TD PT72
MEDICATED_PATCH | TRANSDERMAL | Status: AC
  Administered 2012-06-04: 1.5 mg via TRANSDERMAL
  Filled 2012-06-04: qty 1

## 2012-06-04 MED ORDER — TERBUTALINE SULFATE 1 MG/ML IJ SOLN: 0.2500 mg | Freq: Once | INTRAMUSCULAR | Status: DC | PRN

## 2012-06-04 MED ORDER — LACTATED RINGERS IV SOLN
500.0000 mL | INTRAVENOUS | Status: DC | PRN
  Administered 2012-06-04: 890 mL via INTRAVENOUS

## 2012-06-04 MED ORDER — ONDANSETRON HCL 4 MG/2ML IJ SOLN
INTRAMUSCULAR | Status: DC | PRN
  Administered 2012-06-04: 4 mg via INTRAVENOUS

## 2012-06-04 MED ORDER — FENTANYL CITRATE 0.05 MG/ML IJ SOLN
INTRAMUSCULAR | Status: AC
  Filled 2012-06-04: qty 2

## 2012-06-04 MED ORDER — SODIUM BICARBONATE 8.4 % IV SOLN
INTRAVENOUS | Status: DC | PRN
  Administered 2012-06-04: 5 mL via EPIDURAL

## 2012-06-04 MED ORDER — LACTATED RINGERS IV SOLN
INTRAVENOUS | Status: DC | PRN
  Administered 2012-06-04: 21:00:00 via INTRAVENOUS

## 2012-06-04 MED ORDER — OXYTOCIN 10 UNIT/ML IJ SOLN
INTRAMUSCULAR | Status: AC
  Filled 2012-06-04: qty 4

## 2012-06-04 MED ORDER — KETOROLAC TROMETHAMINE 60 MG/2ML IM SOLN
60.0000 mg | Freq: Once | INTRAMUSCULAR | Status: AC | PRN
  Administered 2012-06-04: 60 mg via INTRAMUSCULAR

## 2012-06-04 MED ORDER — LIDOCAINE-EPINEPHRINE (PF) 2 %-1:200000 IJ SOLN
INTRAMUSCULAR | Status: AC
  Filled 2012-06-04: qty 20

## 2012-06-04 MED ORDER — IBUPROFEN 600 MG PO TABS: 600.0000 mg | ORAL_TABLET | Freq: Four times a day (QID) | ORAL | Status: DC | PRN

## 2012-06-04 MED ORDER — MORPHINE SULFATE (PF) 0.5 MG/ML IJ SOLN
INTRAMUSCULAR | Status: DC | PRN
  Administered 2012-06-04: 1 mg via INTRAVENOUS

## 2012-06-04 MED ORDER — KETOROLAC TROMETHAMINE 60 MG/2ML IM SOLN
INTRAMUSCULAR | Status: AC
  Filled 2012-06-04: qty 2

## 2012-06-04 MED ORDER — OXYTOCIN 40 UNITS IN LACTATED RINGERS INFUSION - SIMPLE MED
1.0000 m[IU]/min | INTRAVENOUS | Status: DC
  Administered 2012-06-04: 2 m[IU]/min via INTRAVENOUS
  Filled 2012-06-04: qty 1000

## 2012-06-04 MED ORDER — OXYTOCIN 10 UNIT/ML IJ SOLN
40.0000 [IU] | INTRAVENOUS | Status: DC | PRN
  Administered 2012-06-04: 40 [IU] via INTRAVENOUS

## 2012-06-04 MED ORDER — LIDOCAINE HCL (PF) 1 % IJ SOLN: 30.0000 mL | INTRAMUSCULAR | Status: DC | PRN

## 2012-06-04 MED ORDER — OXYCODONE-ACETAMINOPHEN 5-325 MG PO TABS: 1.0000 | ORAL_TABLET | ORAL | Status: DC | PRN

## 2012-06-04 MED ORDER — EPHEDRINE 5 MG/ML INJ: 10.0000 mg | INTRAVENOUS | Status: DC | PRN

## 2012-06-04 SURGICAL SUPPLY — 35 items
BARRIER ADHS 3X4 INTERCEED (GAUZE/BANDAGES/DRESSINGS) IMPLANT
BENZOIN TINCTURE PRP APPL 2/3 (GAUZE/BANDAGES/DRESSINGS) ×3 IMPLANT
CLOTH BEACON ORANGE TIMEOUT ST (SAFETY) ×3 IMPLANT
DRAPE LG THREE QUARTER DISP (DRAPES) ×3 IMPLANT
DRSG OPSITE 6X11 MED (GAUZE/BANDAGES/DRESSINGS) ×3 IMPLANT
DRSG OPSITE POSTOP 4X10 (GAUZE/BANDAGES/DRESSINGS) IMPLANT
DURAPREP 26ML APPLICATOR (WOUND CARE) ×3 IMPLANT
ELECT REM PT RETURN 9FT ADLT (ELECTROSURGICAL) ×3
ELECTRODE REM PT RTRN 9FT ADLT (ELECTROSURGICAL) ×2 IMPLANT
EXTRACTOR VACUUM M CUP 4 TUBE (SUCTIONS) IMPLANT
GLOVE BIO SURGEON STRL SZ 6.5 (GLOVE) IMPLANT
GLOVE BIO SURGEON STRL SZ7.5 (GLOVE) ×3 IMPLANT
GLOVE BIOGEL PI IND STRL 7.0 (GLOVE) IMPLANT
GLOVE BIOGEL PI IND STRL 8 (GLOVE) ×2 IMPLANT
GLOVE BIOGEL PI INDICATOR 7.0 (GLOVE)
GLOVE BIOGEL PI INDICATOR 8 (GLOVE) ×1
GOWN PREVENTION PLUS XLARGE (GOWN DISPOSABLE) IMPLANT
GOWN STRL REIN XL XLG (GOWN DISPOSABLE) ×6 IMPLANT
KIT ABG SYR 3ML LUER SLIP (SYRINGE) ×6 IMPLANT
NEEDLE HYPO 25X5/8 SAFETYGLIDE (NEEDLE) ×6 IMPLANT
NS IRRIG 1000ML POUR BTL (IV SOLUTION) ×3 IMPLANT
PACK C SECTION WH (CUSTOM PROCEDURE TRAY) ×3 IMPLANT
PAD ABD 7.5X8 STRL (GAUZE/BANDAGES/DRESSINGS) ×3 IMPLANT
PAD OB MATERNITY 4.3X12.25 (PERSONAL CARE ITEMS) ×3 IMPLANT
SLEEVE SCD COMPRESS KNEE MED (MISCELLANEOUS) IMPLANT
SPONGE LAP 18X18 X RAY DECT (DISPOSABLE) ×6 IMPLANT
STRIP CLOSURE SKIN 1/2X4 (GAUZE/BANDAGES/DRESSINGS) ×3 IMPLANT
SUT GUT PLAIN 0 CT-3 TAN 27 (SUTURE) ×6 IMPLANT
SUT MON AB 4-0 PS1 27 (SUTURE) ×3 IMPLANT
SUT PLAIN 2 0 XLH (SUTURE) ×3 IMPLANT
SUT VIC AB 0 CT1 36 (SUTURE) ×12 IMPLANT
TAPE CLOTH SURG 4X10 WHT LF (GAUZE/BANDAGES/DRESSINGS) ×3 IMPLANT
TOWEL OR 17X24 6PK STRL BLUE (TOWEL DISPOSABLE) ×9 IMPLANT
TRAY FOLEY CATH 14FR (SET/KITS/TRAYS/PACK) IMPLANT
WATER STERILE IRR 1000ML POUR (IV SOLUTION) ×3 IMPLANT

## 2012-06-04 NOTE — Consult Note (Signed)
Neonatology Note:   Attendance at C-section:    I was asked by Dr. Christell Constant to attend this primary C/S at 41+ weeks due to repetitive variable FHR decelerations and failure to progress. The mother is a G5P3A1 B pos, GBS neg with known fetal macrosomia and a history of fibroids. ROM 13 hours prior to delivery, fluid clear. Infant vigorous with good spontaneous cry and tone. Needed only minimal bulb suctioning. Ap 8/9. Lungs clear to ausc in DR. To CN to care of Pediatrician.   Doretha Sou, MD

## 2012-06-04 NOTE — Transfer of Care (Signed)
Immediate Anesthesia Transfer of Care Note  Patient: Sherri Galvan  Procedure(s) Performed: Procedure(s): CESAREAN SECTION (N/A) BILATERAL TUBAL LIGATION (Bilateral)  Patient Location: PACU  Anesthesia Type:Epidural  Level of Consciousness: awake  Airway & Oxygen Therapy: Patient Spontanous Breathing  Post-op Assessment: Report given to PACU RN and Post -op Vital signs reviewed and stable  Post vital signs: stable  Complications: No apparent anesthesia complications

## 2012-06-04 NOTE — Progress Notes (Signed)
Sherri Galvan is a 32 y.o. Z6X0960 at [redacted]w[redacted]d by ultrasound admitted for induction of labor due to Post dates. Due date 05-25-12.,   Subjective: Ready for CLE Desires BTL again  Objective: BP 118/71  Pulse 83  Temp(Src) 98.2 F (36.8 C) (Oral)  Resp 20  Ht 5\' 7"  (1.702 m)  Wt 89.359 kg (197 lb)  BMI 30.85 kg/m2  LMP 08/15/2011      FHT:  FHR: 140s bpm, variability: moderate,  accelerations:  Present,  decelerations:  Absent UC:   irregular, every 10 minutes SVE:   Dilation: 3.5 Effacement (%): 70 Station: Ballotable Exam by:: dr Christell Constant  Labs: Lab Results  Component Value Date   WBC 7.7 06/04/2012   HGB 11.8* 06/04/2012   HCT 34.7* 06/04/2012   MCV 96.1 06/04/2012   PLT 180 06/04/2012    Assessment / Plan: IOL for post term - desires BTL amniotomy performed with clear fliuid -macrosomic 4000+gm 8#14  Labor: pitocin is to start Preeclampsia:  na Fetal Wellbeing:  Category I Pain Control:  Epidural I/D:  n/a Anticipated MOD:  NSVD  Fidencio Duddy H. 06/04/2012, 8:24 AM

## 2012-06-04 NOTE — Progress Notes (Signed)
Patient ID: Sherri Galvan, female   DOB: [birth year], 32 y.o.   MRN: [id number] Sherri Galvan is a 32 y.o. [id number] at [redacted]w[redacted]d by ultrasound admitted for induction of labor due to Post dates. Due date 05-25-12.  Subjective:  Comfortable with CLE   Objective: BP 116/68  Pulse 87  Temp(Src) 98.3 F (36.8 C) (Oral)  Resp 20  Ht 5\' 7"  (1.702 m)  Wt 89.359 kg (197 lb)  BMI 30.85 kg/m2  SpO2 97%  LMP 08/15/2011     PIT at 6 still ongoing at 6   IUPC just placed without difficulty or increased in bleeding as there is some dark bloody show   FHT:  FHR: 130s bpm, variability: moderate,  accelerations:  Abscent,  decelerations:  Present severe and at times have been prolonged and the decelerations are variable and nonrepetitive - some have been  prolonged and the overall category is II but is reassuring in between decletations - decelerations   Will attempt IUPC  UC:   irregular, every 2-4  Minutes SVE:   Dilation: 8 Effacement (%): 80 Station: -2 Exam by:: Dr Sherri Galvan  Labs: Lab Results  Component Value Date   WBC 7.7 06/04/2012   HGB 11.8* 06/04/2012   HCT 34.7* 06/04/2012   MCV 96.1 06/04/2012   PLT 180 06/04/2012    Assessment / Plan: induction of labor and the severe variables are being treated now with positioning and amnioinfusion  Labor: either protracted or arresting and will monitor clinically but in light of the macrosomia and the FHR  delivery may be warranted soon Preeclampsia:  na Fetal Wellbeing:  Category II Pain Control:  Epidural I/D:  n/a Anticipated MOD:  am concerned and have informed the family of the possibility of CSx  Sherri Galvan. 06/04/2012, 6:40 PM

## 2012-06-04 NOTE — Progress Notes (Signed)
Sherri Galvan is a 32 y.o. [id number] at [redacted]w[redacted]d by ultrasound admitted for induction of labor due to Post dates. Due date 05-25-12.  Subjective:  Comfortable with CLE Objective: BP 114/73  Pulse 79  Temp(Src) 97.8 F (36.6 C) (Oral)  Resp 18  Ht 5\' 7"  (1.702 m)  Wt 89.359 kg (197 lb)  BMI 30.85 kg/m2  SpO2 98%  LMP 08/15/2011     PIT at 6 FHT:  FHR: 130s bpm, variability: moderate,  accelerations:  Present,  decelerations:  Present mild and nonrepetitive UC:   irregular, every 3-5 minutes SVE:   Dilation: 4 Effacement (%): 80 Station: -2 Exam by:: Dherr rn  Labs: Lab Results  Component Value Date   WBC 7.7 06/04/2012   HGB 11.8* 06/04/2012   HCT 34.7* 06/04/2012   MCV 96.1 06/04/2012   PLT 180 06/04/2012    Assessment / Plan: Induction of labor due to post term,  progressing well on pitocin  Labor: Progressing normally Preeclampsia:  na Fetal Wellbeing:  Category II Pain Control:  Epidural I/D:  n/a Anticipated MOD:  NSVD As we have had good progress Sherri Galvan H. 06/04/2012, 12:31 PM

## 2012-06-04 NOTE — Progress Notes (Addendum)
Sherri Galvan is a 32 y.o. [id number] at [redacted]w[redacted]d by ultrasound admitted for induction of labor due to Post dates. Due date 05-25-12-- macrosomia and desires sterilization.  Subjective: Still comfortable  Objective: BP 107/59  Pulse 97  Temp(Src) 98.5 F (36.9 C) (Oral)  Resp 20  Ht 5\' 7"  (1.702 m)  Wt 89.359 kg (197 lb)  BMI 30.85 kg/m2  SpO2 97%  LMP 08/15/2011      FHT:  FHR: 130s bpm, variability: moderate,  accelerations:  Present,  decelerations:  Present variable decelerations that have not completely resolved and have been treated conservatively with O2 IVF and positions changes and amnioinfusion UC:   regular, every 2-4 minutes SVE:   Dilation: 8 Effacement (%): 80 Station: -2 Exam by:: Dr Christell Constant Now with signinficant caput and molding that was not present earlier Labs: Lab Results  Component Value Date   WBC 7.7 06/04/2012   HGB 11.8* 06/04/2012   HCT 34.7* 06/04/2012   MCV 96.1 06/04/2012   PLT 180 06/04/2012    Assessment / Plan: Arrest in active phase of labor nonreassuring fetal status persistent severe variable decelerations  Labor: arrested Preeclampsia:  na Fetal Wellbeing:  Category II Pain Control:  Epidural I/D:  n/a Anticipated MOD:  will proceed to OR for abdominal delivery CSX and BTL  Sherri Galvan H. 06/04/2012, 8:20 PM

## 2012-06-04 NOTE — Anesthesia Postprocedure Evaluation (Signed)
  Anesthesia Post-op Note  Patient: Sherri Galvan  Procedure(s) Performed: Procedure(s): CESAREAN SECTION (N/A) BILATERAL TUBAL LIGATION (Bilateral)   Patient is awake, responsive, moving her legs, and has signs of resolution of her numbness. Pain and nausea are reasonably well controlled. Vital signs are stable and clinically acceptable. Oxygen saturation is clinically acceptable. There are no apparent anesthetic complications at this time. Patient is ready for discharge.

## 2012-06-04 NOTE — Progress Notes (Signed)
Patient ID: Sherri Galvan, female   DOB: 09-07-1980, 32 y.o.   MRN: [id number] Stop pit and start O2

## 2012-06-04 NOTE — Anesthesia Preprocedure Evaluation (Signed)
Anesthesia Evaluation  Patient identified by MRN, date of birth, ID band Patient awake    Reviewed: Allergy & Precautions, H&P , NPO status , Patient's Chart, lab work & pertinent test results  Airway Mallampati: II TM Distance: >3 FB Neck ROM: full    Dental no notable dental hx.    Pulmonary neg pulmonary ROS,    Pulmonary exam normal       Cardiovascular negative cardio ROS      Neuro/Psych negative psych ROS   GI/Hepatic negative GI ROS, Neg liver ROS,   Endo/Other  negative endocrine ROS  Renal/GU negative Renal ROS  negative genitourinary   Musculoskeletal negative musculoskeletal ROS (+)   Abdominal Normal abdominal exam  (+)   Peds negative pediatric ROS (+)  Hematology negative hematology ROS (+)   Anesthesia Other Findings   Reproductive/Obstetrics (+) Pregnancy                           Anesthesia Physical Anesthesia Plan  ASA: II  Anesthesia Plan: Epidural   Post-op Pain Management:    Induction:   Airway Management Planned:   Additional Equipment:   Intra-op Plan:   Post-operative Plan:   Informed Consent: I have reviewed the patients History and Physical, chart, labs and discussed the procedure including the risks, benefits and alternatives for the proposed anesthesia with the patient or authorized representative who has indicated his/her understanding and acceptance.     Plan Discussed with:   Anesthesia Plan Comments:         Anesthesia Quick Evaluation  

## 2012-06-04 NOTE — Progress Notes (Signed)
Patient ID: Sherri Galvan, female   DOB: 04-Dec-1980, 32 y.o.   MRN: [id number] FHR has responded to  Intrauterine measures and will allow for more recovery and then address labor

## 2012-06-04 NOTE — Anesthesia Procedure Notes (Signed)
Epidural Patient location during procedure: OB Start time: 06/04/2012 9:56 AM End time: 06/04/2012 10:00 AM  Staffing Anesthesiologist: Sandrea Hughs Performed by: anesthesiologist   Preanesthetic Checklist Completed: patient identified, site marked, surgical consent, pre-op evaluation, timeout performed, IV checked, risks and benefits discussed and monitors and equipment checked  Epidural Patient position: sitting Prep: site prepped and draped and DuraPrep Patient monitoring: continuous pulse ox and blood pressure Approach: midline Injection technique: LOR air  Needle:  Needle type: Tuohy  Needle gauge: 17 G Needle length: 9 cm and 9 Needle insertion depth: 6 cm Catheter type: closed end flexible Catheter size: 19 Gauge Catheter at skin depth: 11 cm Test dose: negative and Other  Assessment Sensory level: T8 Events: blood not aspirated, injection not painful, no injection resistance, negative IV test and no paresthesia

## 2012-06-04 NOTE — Op Note (Signed)
Cesarean Section Procedure Note   06/04/2012, 10:22 PM  Sherri Galvan    Procedure Primary Cesarean Section with bilateral tubal ligation  Pre-operative Diagnosis: 1. IUP 41w3             2.  Failed induction             3.  macrosomia             4.  Post term pregnancy             5.  Arrest of dilatation             6.  Nonreassuring fetal status (repetitive severe variable decels                7.  Fibroids             8.  Desires sterilization  Post-operative Diagnosis: Same plus delivered plus delivered  Surgeon: Delbert Harness.  Assistants: CST   Anesthesia: epidural    Findings:      1. Viable female infant with Apgars  APGAR (1 MIN):   APGAR (5 MINS):   APGAR (10 MINS):  with a weight of @BABYWGTLBSEBC @ @BABYWGTOZEBC @     2. umbilical cord artery pH 7.2    3. the placenta was calcified is a term placenta.    4. a true knot was noted in the cord   Estimated Blood Loss: 900 ml  Total IV Fluids: 2800 ml   Urine Output: 150CC OF bloody urine  Specimens: 1. UA and UV gases and placenta  Complications: no complications  Disposition:  PACU - hemodynamically stable.  Maternal Condition: stable  Baby condition / location:  nursery-stable with dad in OR  Indications:severe variable decelerations that would not completely resolve and at the same time arrest of dilatation at 8 cm          The risks, benefits, complications, treatment options, and expected outcomes were discussed with the patient. The patient concurred with the proposed plan, giving informed consent As well as the risks, benefits and alternatives of tubal ligation and specifically that it was irreversible and permanent and potential failures with unwanted IUPs and or ectopic pregnancy which could be lethal  Procedure Details:  The patient was moved to the operating room.  The patient was identified as  Sherri Galvan and the procedure verified as C-Section Delivery with Bilateral tubal ligation. A  Time Out was held and the above information confirmed.  Two units of pRBCs were available.  Ancef 2 grams was given.   The patient was draped and prepped in the usual sterile manner.  Anesthesia was noted to be adequate.   Pfannenstiel incsion was made and the subcutaneous tissue was incised to the fascia. The fascial was nicked over both rectus muscle bellies and then was extended transversely. Kocher clamps and the the fascia was separated from the underlying rectus muscle superiorly and inferiorly. The peritoneum was entered bluntly. The utero-vesical peritoneal reflection was elevated and incised transversely and the bladder flap was bluntly freed from the lower uterine segment. A low transverse uterine incision was made and maintained within the lower uterine segment. Delivered from cephalic presentation was a ___ gram living newborn female infant with Apgar scores of __ at one minute and __ at five minutes. A cord ph was sent for UA and UV. The umbilical cord was clamped prior to obtaining this. The cord was cut to free the baby and then the cord was clamped at  the abdomen exit of the cord.   A sample of cord blood was obtained for evaluation. The placenta was removed Intact and appeared normal with a notation of a shoulder cord.. The uterus curretted with a sponge.   The uterine incision was closed with running locked sutures of 0 Vicryl in two layers.  Hemostasis was observed.   The 0 plain gut was used to doubly ligate a knuckle of tissue and the section of tube removed and the endosalpinx was cauterized on both cut ends.  This was performed on both tubes.  The paracolic gutter sponges  were removed. The uterus was returned to the abdomen.  The fascia was then reapproximated with running sutures of  0 Vicryl.The skin was closed in a subcuticular fashion with 4-0 monocryl. The wound was appropriately dressed patient placed on bed and taken to the recovery room in awake and stable condition and the baby  was in the father's arms heading to the birthing room.   Instrument sponge lap and needle  counts were correct prior the abdominal closure and were correct at the conclusion of the case.     Signed: Surgeon(s): Waverly Ferrari. Christell Constant, MD

## 2012-06-05 LAB — CBC
Hemoglobin: 9.1 g/dL — ABNORMAL LOW (ref 12.0–15.0)
MCH: 32.7 pg (ref 26.0–34.0)
MCHC: 33.8 g/dL (ref 30.0–36.0)
Platelets: 159 10*3/uL (ref 150–400)
RDW: 12.4 % (ref 11.5–15.5)

## 2012-06-05 LAB — HEMOGLOBIN AND HEMATOCRIT, BLOOD: Hemoglobin: 8 g/dL — ABNORMAL LOW (ref 12.0–15.0)

## 2012-06-05 MED ORDER — IBUPROFEN 600 MG PO TABS
600.0000 mg | ORAL_TABLET | Freq: Four times a day (QID) | ORAL | Status: DC
  Administered 2012-06-05 – 2012-06-07 (×8): 600 mg via ORAL
  Filled 2012-06-05 (×9): qty 1

## 2012-06-05 MED ORDER — LACTATED RINGERS IV SOLN
INTRAVENOUS | Status: DC
  Administered 2012-06-05 (×2): via INTRAVENOUS

## 2012-06-05 MED ORDER — SODIUM CHLORIDE 0.45 % IV BOLUS
1000.0000 mL | Freq: Once | INTRAVENOUS | Status: AC
  Administered 2012-06-05: 1000 mL via INTRAVENOUS

## 2012-06-05 MED ORDER — KETOROLAC TROMETHAMINE 30 MG/ML IJ SOLN: 30.0000 mg | Freq: Four times a day (QID) | INTRAMUSCULAR | Status: AC | PRN

## 2012-06-05 MED ORDER — ONDANSETRON HCL 4 MG/2ML IJ SOLN
4.0000 mg | INTRAMUSCULAR | Status: DC | PRN
  Administered 2012-06-05: 4 mg via INTRAVENOUS
  Filled 2012-06-05: qty 2

## 2012-06-05 MED ORDER — LANOLIN HYDROUS EX OINT: 1.0000 "application " | TOPICAL_OINTMENT | CUTANEOUS | Status: DC | PRN

## 2012-06-05 MED ORDER — NALOXONE HCL 1 MG/ML IJ SOLN
1.0000 ug/kg/h | INTRAVENOUS | Status: DC | PRN
  Filled 2012-06-05: qty 2

## 2012-06-05 MED ORDER — SENNOSIDES-DOCUSATE SODIUM 8.6-50 MG PO TABS
2.0000 | ORAL_TABLET | Freq: Every day | ORAL | Status: DC
  Administered 2012-06-05 – 2012-06-06 (×2): 2 via ORAL

## 2012-06-05 MED ORDER — ZOLPIDEM TARTRATE 5 MG PO TABS: 5.0000 mg | ORAL_TABLET | Freq: Every evening | ORAL | Status: DC | PRN

## 2012-06-05 MED ORDER — ONDANSETRON HCL 4 MG/2ML IJ SOLN: 4.0000 mg | Freq: Three times a day (TID) | INTRAMUSCULAR | Status: DC | PRN

## 2012-06-05 MED ORDER — NALOXONE HCL 0.4 MG/ML IJ SOLN: 0.4000 mg | INTRAMUSCULAR | Status: DC | PRN

## 2012-06-05 MED ORDER — SIMETHICONE 80 MG PO CHEW
80.0000 mg | CHEWABLE_TABLET | Freq: Three times a day (TID) | ORAL | Status: DC
  Administered 2012-06-05 – 2012-06-07 (×6): 80 mg via ORAL

## 2012-06-05 MED ORDER — DIBUCAINE 1 % RE OINT: 1.0000 "application " | TOPICAL_OINTMENT | RECTAL | Status: DC | PRN

## 2012-06-05 MED ORDER — OXYCODONE-ACETAMINOPHEN 5-325 MG PO TABS
1.0000 | ORAL_TABLET | ORAL | Status: DC | PRN
  Administered 2012-06-05 – 2012-06-06 (×4): 1 via ORAL
  Administered 2012-06-07 (×2): 2 via ORAL
  Administered 2012-06-07: 1 via ORAL
  Filled 2012-06-05 (×3): qty 1
  Filled 2012-06-05: qty 2
  Filled 2012-06-05: qty 1
  Filled 2012-06-05: qty 2
  Filled 2012-06-05: qty 1

## 2012-06-05 MED ORDER — METHYLERGONOVINE MALEATE 0.2 MG/ML IJ SOLN: 0.2000 mg | INTRAMUSCULAR | Status: DC | PRN

## 2012-06-05 MED ORDER — NALBUPHINE SYRINGE 5 MG/0.5 ML
5.0000 mg | INJECTION | INTRAMUSCULAR | Status: DC | PRN
  Filled 2012-06-05: qty 1

## 2012-06-05 MED ORDER — PRENATAL MULTIVITAMIN CH
1.0000 | ORAL_TABLET | Freq: Every day | ORAL | Status: DC
  Administered 2012-06-06 – 2012-06-07 (×2): 1 via ORAL
  Filled 2012-06-05 (×2): qty 1

## 2012-06-05 MED ORDER — DIPHENHYDRAMINE HCL 25 MG PO CAPS: 25.0000 mg | ORAL_CAPSULE | Freq: Four times a day (QID) | ORAL | Status: DC | PRN

## 2012-06-05 MED ORDER — METOCLOPRAMIDE HCL 5 MG/ML IJ SOLN
10.0000 mg | Freq: Three times a day (TID) | INTRAMUSCULAR | Status: DC | PRN
  Administered 2012-06-05: 10 mg via INTRAVENOUS
  Filled 2012-06-05: qty 2

## 2012-06-05 MED ORDER — SIMETHICONE 80 MG PO CHEW
80.0000 mg | CHEWABLE_TABLET | ORAL | Status: DC | PRN
  Administered 2012-06-05: 80 mg via ORAL

## 2012-06-05 MED ORDER — SODIUM CHLORIDE 0.9 % IJ SOLN: 3.0000 mL | INTRAMUSCULAR | Status: DC | PRN

## 2012-06-05 MED ORDER — DIPHENHYDRAMINE HCL 50 MG/ML IJ SOLN: 12.5000 mg | INTRAMUSCULAR | Status: DC | PRN

## 2012-06-05 MED ORDER — TETANUS-DIPHTH-ACELL PERTUSSIS 5-2.5-18.5 LF-MCG/0.5 IM SUSP
0.5000 mL | Freq: Once | INTRAMUSCULAR | Status: AC
  Administered 2012-06-07: 0.5 mL via INTRAMUSCULAR
  Filled 2012-06-05: qty 0.5

## 2012-06-05 MED ORDER — DIPHENHYDRAMINE HCL 50 MG/ML IJ SOLN: 25.0000 mg | INTRAMUSCULAR | Status: DC | PRN

## 2012-06-05 MED ORDER — OXYTOCIN 40 UNITS IN LACTATED RINGERS INFUSION - SIMPLE MED: 62.5000 mL/h | INTRAVENOUS | Status: AC

## 2012-06-05 MED ORDER — PROMETHAZINE HCL 25 MG/ML IJ SOLN
25.0000 mg | Freq: Once | INTRAMUSCULAR | Status: AC
  Administered 2012-06-05: 25 mg via INTRAVENOUS
  Filled 2012-06-05: qty 1

## 2012-06-05 MED ORDER — WITCH HAZEL-GLYCERIN EX PADS: 1.0000 "application " | MEDICATED_PAD | CUTANEOUS | Status: DC | PRN

## 2012-06-05 MED ORDER — MENTHOL 3 MG MT LOZG: 1.0000 | LOZENGE | OROMUCOSAL | Status: DC | PRN

## 2012-06-05 MED ORDER — ONDANSETRON HCL 4 MG PO TABS: 4.0000 mg | ORAL_TABLET | ORAL | Status: DC | PRN

## 2012-06-05 MED ORDER — METHYLERGONOVINE MALEATE 0.2 MG PO TABS: 0.2000 mg | ORAL_TABLET | ORAL | Status: DC | PRN

## 2012-06-05 MED ORDER — DIPHENHYDRAMINE HCL 25 MG PO CAPS: 25.0000 mg | ORAL_CAPSULE | ORAL | Status: DC | PRN

## 2012-06-05 NOTE — Anesthesia Postprocedure Evaluation (Signed)
Anesthesia Post Note  Patient: Sherri Galvan  Procedure(s) Performed: Procedure(s) (LRB): CESAREAN SECTION (N/A) BILATERAL TUBAL LIGATION (Bilateral)  Anesthesia type: Epidural  Patient location: Mother/Baby  Post pain: Pain level controlled  Post assessment: Post-op Vital signs reviewed  Last Vitals:  Filed Vitals:   06/05/12 0540  BP: 124/60  Pulse: 110  Temp:   Resp:     Post vital signs: Reviewed  Level of consciousness: awake  Complications: No apparent anesthesia complications

## 2012-06-05 NOTE — Progress Notes (Signed)

## 2012-06-05 NOTE — Progress Notes (Signed)
Pt with low UO overnight, urine tea colored.  Pt has persistant n/v unrelieved by Reglan or Zofran.  VSS. Lochia small amount.  Incision with pressure dressing CDI.  Abdomen soft and appropriately tender to palpation.  Pt reports no pain in between assessments.  Phoned Dr. Christell Constant with above information.  See orders for IV fluid bolus and Phenergan.

## 2012-06-05 NOTE — Progress Notes (Signed)
UR chart review completed.  

## 2012-06-05 NOTE — Progress Notes (Signed)
Subjective: Postpartum Day 1: Cesarean Delivery/BTL Patient reports tolerating PO.  States she had some nausea and emesis earlier and the phenergan worked and she is hungry and eating a sandwich and states pain is much improved.   Her UO was minimal throughout the evening but getting less than 100 ml per hour and did respond to a fluid challenge and has had no change in pulse or blood pressure   the pt states she is well now and not lightheaded even when out of bed earlier  Objective: Vital signs in last 24 hours: Temp:  [97 F (36.1 C)-98.5 F (36.9 C)] 98.3 F (36.8 C) (04/04 1437) Pulse Rate:  [65-114] 82 (04/04 1437) Resp:  [13-21] 18 (04/04 1437) BP: (95-124)/(47-79) 115/74 mmHg (04/04 1437) SpO2:  [95 %-100 %] 100 % (04/04 1437)  Physical Exam:  General: alert, cooperative and no distress Lochia: appropriate Uterine Fundus: firm Incision: bandage dry DVT Evaluation: No evidence of DVT seen on physical exam. Negative Homan's sign. No cords or calf tenderness. No significant calf/ankle edema.   Recent Labs  06/04/12 2235 06/05/12 0610  HGB 10.1* 9.1*  HCT 29.5* 26.9*    Assessment/Plan: Status post Cesarean section. Doing well postoperatively. complicated by oliguria that responded to a fluid challenage  Continue pp care and also discussed circ with mom  Also will administer another fluid bolus as the urine still appears dark gold.  Emilyann Banka H. 06/05/2012, 3:33 PM

## 2012-06-06 LAB — BASIC METABOLIC PANEL
CO2: 24 mEq/L (ref 19–32)
Calcium: 8.6 mg/dL (ref 8.4–10.5)
Chloride: 104 mEq/L (ref 96–112)
Creatinine, Ser: 0.82 mg/dL (ref 0.50–1.10)
GFR calc Af Amer: >90 mL/min (ref >90)
Sodium: 135 mEq/L (ref 135–145)

## 2012-06-06 LAB — CBC
MCH: 32.9 pg (ref 26.0–34.0)
MCV: 97.3 fL (ref 78.0–100.0)
Platelets: 156 10*3/uL (ref 150–400)
RBC: 2.25 MIL/uL — ABNORMAL LOW (ref 3.87–5.11)
RDW: 12.6 % (ref 11.5–15.5)
WBC: 15.1 10*3/uL — ABNORMAL HIGH (ref 4.0–10.5)

## 2012-06-06 NOTE — Progress Notes (Signed)
Subjective: Postpartum Day 2: Cesarean Delivery Patient reports incisional pain and no problems voiding.    Objective: Vital signs in last 24 hours: Temp:  [97.6 F (36.4 C)-98.4 F (36.9 C)] 97.6 F (36.4 C) (04/05 0539) Pulse Rate:  [78-101] 89 (04/05 0539) Resp:  [16-20] 18 (04/05 0539) BP: (98-115)/(54-74) 98/64 mmHg (04/05 0539) SpO2:  [96 %-100 %] 98 % (04/05 0539)  Physical Exam:  General: alert, cooperative and no distress Lochia: appropriate Uterine Fundus: firm Incision: healing well - dressing is clean DVT Evaluation: No evidence of DVT seen on physical exam. Negative Homan's sign. No cords or calf tenderness. No significant calf/ankle edema.   Recent Labs  06/05/12 0610 06/05/12 1625  HGB 9.1* 8.0*  HCT 26.9* 23.7*    Assessment/Plan: Status post Cesarean section. Doing well postoperatively.  Continue current care DC tomorrow.  Guiselle Mian H. 06/06/2012, 7:12 AM

## 2012-06-07 MED ORDER — OXYCODONE-ACETAMINOPHEN 5-325 MG PO TABS: 1.0000 | ORAL_TABLET | ORAL | Status: DC | PRN

## 2012-06-07 MED ORDER — FERROUS GLUCONATE 216 MG PO TABS: 216.0000 mg | ORAL_TABLET | Freq: Three times a day (TID) | ORAL | Status: DC

## 2012-06-07 NOTE — Discharge Summary (Signed)
Obstetric Discharge Summary Reason for Admission: induction of labor Prenatal Procedures: none Intrapartum Procedures: cesarean: low cervical, transverse, tubal ligation and amnioinfusion for severe variable decelerations and CLE  Postpartum Procedures: none Complications-Operative and Postpartum: none Hemoglobin  Date Value Range Status  06/06/2012 7.4* 12.0 - 15.0 g/dL Final     HCT  Date Value Range Status  06/06/2012 21.9* 36.0 - 46.0 % Final    Physical Exam:  General: alert, cooperative and no distress Lochia: appropriate Uterine Fundus: firm Incision: healing well DVT Evaluation: No evidence of DVT seen on physical exam. Negative Homan's sign. No cords or calf tenderness.  Discharge Diagnoses: post term pregnancy Acute blood loss anemia  Fibroid uterus  Arrest of dilatation nonreassuring fetal status(severe variable decelerations) Failed induction S/P CSx with BTL   Discharge Information: Date: 06/07/2012 Activity: pelvic rest and no heavy lifting Diet: routine Medications: PNV, Ibuprofen, Iron and Percocet Condition: stable Instructions: refer to practice specific booklet Discharge to: home Follow-up Information   Follow up with [facility] On 06/08/2012. (9:30)    Contact information:   Fax # [phone]      Newborn Data: Live born female  Birth Weight: 8 lb 0.2 oz (3635 g) APGAR: 8, 9  Home with mother.  Sherri Criss H. 06/07/2012, 9:50 AM

## 2012-06-07 NOTE — Progress Notes (Signed)
Patient ID: Sherri Galvan, female   DOB: 10-02-1980, 32 y.o.   MRN: [id number] See dc summary

## 2012-06-08 ENCOUNTER — Encounter (HOSPITAL_COMMUNITY): Payer: Self-pay | Admitting: Obstetrics & Gynecology

## 2012-06-08 LAB — TYPE AND SCREEN
ABO/RH(D): B POS
Antibody Screen: NEGATIVE
Unit division: 0
Unit division: 0

## 2012-06-10 ENCOUNTER — Ambulatory Visit (HOSPITAL_COMMUNITY)
Admission: RE | Admit: 2012-06-10 | Payer: Medicaid Other | Source: Ambulatory Visit | Admitting: Obstetrics & Gynecology

## 2012-06-22 NOTE — H&P (Signed)
Delbert Harness, MD Physician Signed  H&P Service date: 06/02/2012 3:26 PM  Related encounter: Orders Only from 06/02/2012 in [facility]  Sherri Galvan is a 32 y.o. female presenting for post term induction. Maternal Medical History:   Reason for admission: Nausea. 32 yo G5 P3013 at 41w3 on 06-04-12 after EDC of 05-25-12 with 4022g fetus at 86% with BPP of 8/8 on 06-02-12.   She is now being admitted for induction of labor.  She desires a CLE and does not desire elective CSx and understands the increase risk of CSx with induction of labor.   She desires circumcision and also BTL.   She is GBBS negative.     Her pregnancy is complicated by fibroids 6 cm and 4 cm and above the cervix and LUS.       Fetal activity: Perceived fetal activity is normal.     Prenatal complications: no prenatal complications No bleeding, cholelithiasis, HIV, hypertension, infection, IUGR, nephrolithiasis, oligohydramnios, placental abnormality, polyhydramnios, pre-eclampsia, preterm labor, substance abuse, thrombocytopenia or thrombophilia.   Prenatal Complications - Diabetes: none.   OB History     Grav  Para  Term  Preterm  Abortions  TAB  SAB  Ect  Mult  Living     4  3      1          3        Past Medical History   Diagnosis  Date   .  UTI (lower urinary tract infection)  03-24-11       finishing macrobid   .  Headache     .  Fibroid      Past Surgical History   Procedure  Laterality  Date   .  Dilation and evacuation    04/15/2011       Procedure: DILATATION AND EVACUATION;  Surgeon: Fortino Sic, MD;  Location: [facility];  Service: Gynecology;  Laterality: N/A;  12wks: Performed by Dr. Filbert Berthold    Family History: family history includes Breast cancer in her maternal grandmother and Cancer in her maternal grandmother. Social History: reports that she has never smoked. She does not have any smokeless tobacco history on file. She reports that she does not drink alcohol or use illicit  drugs.      Prenatal Transfer Tool    Maternal Diabetes: No Genetic Screening: Normal NT/Early screen negative and AFP negative Maternal Ultrasounds/Referrals: Normal Fetal Ultrasounds or other Referrals:  None Maternal Substance Abuse:  No Significant Maternal Medications:  None Significant Maternal Lab Results:  None Other Comments:  None   Review of Systems  Constitutional: Negative.   HENT: Negative.  Negative for hearing loss, ear pain, sore throat and tinnitus.   Eyes: Negative.  Negative for blurred vision, double vision, photophobia and discharge.  Respiratory: Negative.  Negative for cough, sputum production, shortness of breath and wheezing.   Cardiovascular: Negative.  Negative for chest pain, palpitations, orthopnea and leg swelling.  Gastrointestinal: Negative.  Negative for nausea, vomiting, diarrhea and constipation.  Genitourinary: Negative.  Negative for dysuria, hematuria and flank pain.  Musculoskeletal: Negative.  Negative for myalgias.  Skin: Negative.  Negative for rash.  Neurological: Negative.  Negative for dizziness, tremors, speech change, focal weakness, seizures, loss of consciousness and headaches.  Endo/Heme/Allergies: Negative.  Does not bruise/bleed easily.  Psychiatric/Behavioral: Negative.  Negative for depression, suicidal ideas, hallucinations and substance abuse. The patient is not nervous/anxious.     not currently breastfeeding. Maternal Exam:  Abdomen: Patient reports  no abdominal tenderness. Estimated fetal weight is 8#14.   Fetal presentation: vertex   Introitus: Normal vagina.  Evaluated at last visit   Pelvis: adequate for delivery.   Cervix: Cervix evaluated by digital exam.   05-29-12 Physical Exam  Constitutional: She is oriented to person, place, and time. She appears well-developed and well-nourished.  HENT:   Head: Normocephalic and atraumatic.  Right Ear: External ear normal.  Left Ear: External ear normal.    Mouth/Throat: No oropharyngeal exudate.  Eyes: EOM are normal. Pupils are equal, round, and reactive to light. Right eye exhibits no discharge. Left eye exhibits no discharge.  Neck: Normal range of motion. Neck supple. No tracheal deviation present. No thyromegaly present.  Cardiovascular: Normal rate, regular rhythm and normal heart sounds.   Respiratory: Effort normal and breath sounds normal. She has no wheezes.  GI: Soft.  Gravid non tender  Musculoskeletal: Normal range of motion. She exhibits no edema and no tenderness.  Lymphadenopathy:    She has no cervical adenopathy.  Neurological: She is alert and oriented to person, place, and time. No cranial nerve deficit.  Skin: Skin is warm and dry. No rash noted.  Psychiatric: She has a normal mood and affect. Her behavior is normal. Judgment and thought content normal.  Prenatal labs: ABO, Rh: Bpos Antibody: negative Rubella: immune RPR: nonreactive HBsAg: negative HIV: nonreactive GBS: negative HGB AA Assessment/Plan: [redacted]w[redacted]d   Macrosomia Post term Fibroids Anemia mild Desires sterilization Desires circumcision Desires CLE     Admit for induction of labor, pitocin, proven pelvis   Crimson Dubberly H. 06/02/2012, 3:26 PM

## 2013-01-15 ENCOUNTER — Encounter: Payer: Self-pay | Admitting: Advanced Practice Midwife

## 2013-01-15 ENCOUNTER — Ambulatory Visit (INDEPENDENT_AMBULATORY_CARE_PROVIDER_SITE_OTHER): Payer: Medicaid Other | Admitting: Advanced Practice Midwife

## 2013-01-15 VITALS — BP 120/75 | HR 76 | Temp 98.2°F | Ht 67.0 in | Wt 189.0 lb

## 2013-01-15 DIAGNOSIS — N390 Urinary tract infection, site not specified: Secondary | ICD-10-CM

## 2013-01-15 LAB — POCT URINALYSIS DIPSTICK
Glucose, UA: NEGATIVE
Nitrite, UA: NEGATIVE
Urobilinogen, UA: NEGATIVE

## 2013-01-15 NOTE — Progress Notes (Signed)
  Subjective:    Patient ID: Sherri Galvan, female    DOB: [birth year], 32 y.o.   MRN: [id number]  Urinary Tract Infection  This is a new problem. The current episode started 1 to 4 weeks ago. The problem occurs every urination. The problem has been unchanged. The pain is at a severity of 0/10. The patient is experiencing no pain. There has been no fever. She is sexually active. There is no history of pyelonephritis. Pertinent negatives include no chills or urgency. Associated symptoms comments: Patient reports strong odor w/ urination  . She has tried nothing for the symptoms. The treatment provided no relief.      Review of Systems  Constitutional: Negative.  Negative for chills.  Genitourinary: Negative.  Negative for urgency.       Odor w/ urination   Musculoskeletal: Negative.        Objective:   Physical Exam  Vitals reviewed. Constitutional: She is oriented to person, place, and time. She appears well-developed and well-nourished.  Neurological: She is alert and oriented to person, place, and time. She has normal reflexes.  Skin: Skin is warm and dry.  Psychiatric: She has a normal mood and affect. Her behavior is normal. Judgment and thought content normal.   Filed Vitals:   01/15/13 1111  BP: 120/75  Pulse: 76  Temp: 98.2 F (36.8 C)   Filed Vitals:   01/15/13 1111  Height: 5\' 7"  (1.702 m)  Weight: 189 lb (85.73 kg)          Assessment & Plan:  Afebrile, normotensive UA inconclusive Dehydration Reviewed warning signs of pyelo and UTI. Patient to seek urgent care w/ symptoms. Urine culture sent and pending. Treatment pending results. BCM Tubal ligation  20 min spent with patient greater than 80% spent in counseling and coordination of care.   Raguel Kosloski Wilson Singer CNM

## 2013-01-15 NOTE — Progress Notes (Signed)
Patient is here for a "strong urinary odor and pelvic pressure" for a month.  She denies any pain during urination.

## 2013-01-17 LAB — URINE CULTURE

## 2013-01-20 ENCOUNTER — Other Ambulatory Visit: Payer: Self-pay | Admitting: *Deleted

## 2013-01-20 DIAGNOSIS — N390 Urinary tract infection, site not specified: Secondary | ICD-10-CM

## 2013-01-20 MED ORDER — NITROFURANTOIN MONOHYD MACRO 100 MG PO CAPS: 100.0000 mg | ORAL_CAPSULE | Freq: Two times a day (BID) | ORAL | Status: DC

## 2013-08-06 ENCOUNTER — Ambulatory Visit: Payer: Medicaid Other | Admitting: Advanced Practice Midwife

## 2013-08-17 ENCOUNTER — Encounter: Payer: Self-pay | Admitting: Advanced Practice Midwife

## 2013-08-17 ENCOUNTER — Ambulatory Visit (INDEPENDENT_AMBULATORY_CARE_PROVIDER_SITE_OTHER): Payer: Medicaid Other | Admitting: Advanced Practice Midwife

## 2013-08-17 VITALS — BP 113/60 | HR 82 | Temp 97.3°F | Ht 67.0 in | Wt 179.0 lb

## 2013-08-17 DIAGNOSIS — Z8742 Personal history of other diseases of the female genital tract: Secondary | ICD-10-CM

## 2013-08-17 DIAGNOSIS — B379 Candidiasis, unspecified: Secondary | ICD-10-CM

## 2013-08-17 DIAGNOSIS — Z86018 Personal history of other benign neoplasm: Secondary | ICD-10-CM

## 2013-08-17 DIAGNOSIS — N946 Dysmenorrhea, unspecified: Secondary | ICD-10-CM

## 2013-08-17 MED ORDER — FLUCONAZOLE 150 MG PO TABS: 150.0000 mg | ORAL_TABLET | Freq: Once | ORAL | Status: DC

## 2013-08-17 NOTE — Progress Notes (Signed)
Subjective:     Patient ID: Sherri Galvan, female   DOB: 1980/05/17, 33 y.o.   MRN: [id number]  Vaginal Discharge The patient's primary symptoms include genital itching and a vaginal discharge. This is a new problem. The current episode started in the past 7 days. The problem occurs daily. Pertinent negatives include no abdominal pain or chills. The vaginal discharge was milky. There has been no bleeding. She has not been passing clots. She has not been passing tissue. Nothing aggravates the symptoms. The treatment provided no relief. She is sexually active. It is unknown whether or not her partner has an STD. She uses tubal ligation for contraception. Her menstrual history has been irregular (Having painful heavy periods. Reports hx of fibroids). Her past medical history is significant for a Cesarean section.     Review of Systems  Constitutional: Negative for chills.  Gastrointestinal: Negative for abdominal pain.  Genitourinary: Positive for vaginal discharge.       Objective:   Physical Exam  Vitals reviewed. Constitutional: She is oriented to person, place, and time. She appears well-developed and well-nourished.  HENT:  Head: Normocephalic.  Neck: Normal range of motion.  Pulmonary/Chest: Effort normal.  Abdominal: Soft. Bowel sounds are normal. There is no tenderness.  Genitourinary: Uterus normal. Vaginal discharge found.  +hyphae, negative whiff   Musculoskeletal: Normal range of motion.  Neurological: She is alert and oriented to person, place, and time.  Skin: Skin is warm and dry.  Psychiatric: She has a normal mood and affect. Her behavior is normal.       Assessment:     History of Uterine Fibroids, would like to avoid surgery Recent episodes of dysmenorrhea Current yeast infection     Plan:      Orders Placed This Encounter  Procedures  . WET PREP BY MOLECULAR PROBE  . GC/Chlamydia Probe Amp  . US Pelvis Complete    Standing Status: Future     Number of  Occurrences:      Standing Expiration Date: 10/18/2014    Order Specific Question:  Reason for Exam (SYMPTOM  OR DIAGNOSIS REQUIRED)    Answer:  Pelvic pain, history of fibroids    Order Specific Question:  Preferred imaging location?    Answer:  [facility]   Meds ordered this encounter  Medications  . fluconazole (DIFLUCAN) 150 MG tablet    Sig: Take 1 tablet (150 mg total) by mouth once.    Dispense:  1 tablet    Refill:  0    Order Specific Question:  Supervising Provider    Answer:  Baltazar Najjar A [[id number]]   Patient to f/u w/ me following [place]. Consider Mirena IUD for symptom management.  Amy Roni Bread CNM

## 2013-08-19 LAB — WET PREP BY MOLECULAR PROBE
CANDIDA SPECIES: POSITIVE — AB
GARDNERELLA VAGINALIS: NEGATIVE
TRICHOMONAS VAG: NEGATIVE

## 2013-08-20 ENCOUNTER — Ambulatory Visit (HOSPITAL_COMMUNITY): Payer: Medicaid Other

## 2013-08-20 ENCOUNTER — Ambulatory Visit (HOSPITAL_COMMUNITY)
Admission: RE | Admit: 2013-08-20 | Discharge: 2013-08-20 | Disposition: A | Discharge status: Home / Self Care | Payer: Medicaid Other | Source: Ambulatory Visit | Attending: Advanced Practice Midwife | Admitting: Advanced Practice Midwife

## 2013-08-20 DIAGNOSIS — D252 Subserosal leiomyoma of uterus: Secondary | ICD-10-CM | POA: Diagnosis not present

## 2013-08-20 DIAGNOSIS — N946 Dysmenorrhea, unspecified: Secondary | ICD-10-CM | POA: Insufficient documentation

## 2013-08-20 DIAGNOSIS — Z86018 Personal history of other benign neoplasm: Secondary | ICD-10-CM

## 2013-08-21 LAB — GC/CHLAMYDIA PROBE AMP
CT Probe RNA: NEGATIVE
GC PROBE AMP APTIMA: NEGATIVE

## 2013-09-20 ENCOUNTER — Encounter: Payer: Medicaid Other | Admitting: Obstetrics

## 2013-09-21 ENCOUNTER — Encounter: Payer: Self-pay | Admitting: Obstetrics

## 2013-09-21 ENCOUNTER — Ambulatory Visit (INDEPENDENT_AMBULATORY_CARE_PROVIDER_SITE_OTHER): Payer: Medicaid Other | Admitting: Obstetrics

## 2013-09-21 VITALS — BP 122/68 | HR 74 | Temp 97.8°F | Wt 181.6 lb

## 2013-09-21 DIAGNOSIS — N946 Dysmenorrhea, unspecified: Secondary | ICD-10-CM

## 2013-09-21 DIAGNOSIS — D259 Leiomyoma of uterus, unspecified: Secondary | ICD-10-CM

## 2013-09-21 LAB — POCT URINE PREGNANCY: PREG TEST UR: NEGATIVE

## 2013-09-21 MED ORDER — IBUPROFEN 800 MG PO TABS: 800.0000 mg | ORAL_TABLET | Freq: Three times a day (TID) | ORAL | Status: AC | PRN

## 2013-09-21 NOTE — Progress Notes (Signed)
Patient ID: Sherri Galvan, female   DOB: May 13, 1980, 33 y.o.   MRN: [id number]  Chief Complaint  Patient presents with  . Follow-up    Sonogram     HPI Sherri Galvan is a 33 y.o. female.  Painful periods  HPI  Past Medical History  Diagnosis Date  . UTI (lower urinary tract infection) 03-24-11    finishing macrobid  . Headache(784.0)   . Fibroid   . Anemia     Past Surgical History  Procedure Laterality Date  . Dilation and evacuation  04/15/2011    Procedure: DILATATION AND EVACUATION;  Surgeon: Avel Sensor, MD;  Location: [facility];  Service: Gynecology;  Laterality: N/A;  12wks: Performed by Dr. Carolin Guernsey  . Cesarean section N/A 06/04/2012    Procedure: CESAREAN SECTION;  Surgeon: Jolayne Haines, MD;  Location: [facility];  Service: Obstetrics;  Laterality: N/A;  . Tubal ligation Bilateral 06/04/2012    Procedure: BILATERAL TUBAL LIGATION;  Surgeon: Jolayne Haines, MD;  Location: [facility];  Service: Obstetrics;  Laterality: Bilateral;    Family History  Problem Relation Age of Onset  . Breast cancer Maternal Grandmother     maternal grandmother  . Cancer Maternal Grandmother     Social History History  Substance Use Topics  . Smoking status: Never Smoker   . Smokeless tobacco: Not on file  . Alcohol Use: No    No Known Allergies  Current Outpatient Prescriptions  Medication Sig Dispense Refill  . ibuprofen (ADVIL,MOTRIN) 800 MG tablet Take 1 tablet (800 mg total) by mouth every 8 (eight) hours as needed.  30 tablet  5   No current facility-administered medications for this visit.    Review of Systems Review of Systems Constitutional: negative for fatigue and weight loss Respiratory: negative for cough and wheezing Cardiovascular: negative for chest pain, fatigue and palpitations Gastrointestinal: negative for abdominal pain and change in bowel habits Genitourinary:  Symptomatic uterine fibroids Integument/breast: negative for nipple  discharge Musculoskeletal:negative for myalgias Neurological: negative for gait problems and tremors Behavioral/Psych: negative for abusive relationship, depression Endocrine: negative for temperature intolerance     Blood pressure 122/68, pulse 74, temperature 97.8 F (36.6 C), weight 181 lb 9.6 oz (82.373 kg), last menstrual period 08/16/2013, not currently breastfeeding.  Physical Exam Physical Exam:  Deferred  100% of 10 min visit spent on counseling and coordination of care.   Data Reviewed Ultrasound  Assessment    Symptomatic uterine fibroids Dysmenorrhea     Plan  Patient prefers medical management. Ibuprofen Rx with instructions.   F/U for annual.   Orders Placed This Encounter  Procedures  . POCT urine pregnancy   Meds ordered this encounter  Medications  . ibuprofen (ADVIL,MOTRIN) 800 MG tablet    Sig: Take 1 tablet (800 mg total) by mouth every 8 (eight) hours as needed.    Dispense:  30 tablet    Refill:  5        Sherri Galvan A 09/23/2013, 4:19 AM

## 2013-09-22 ENCOUNTER — Encounter: Payer: Medicaid Other | Admitting: Obstetrics

## 2013-09-23 DIAGNOSIS — N946 Dysmenorrhea, unspecified: Secondary | ICD-10-CM | POA: Insufficient documentation

## 2014-01-03 ENCOUNTER — Encounter: Payer: Self-pay | Admitting: Obstetrics

## 2014-04-12 ENCOUNTER — Ambulatory Visit: Payer: Medicaid Other | Admitting: Obstetrics

## 2014-09-22 ENCOUNTER — Ambulatory Visit: Payer: Medicaid Other | Admitting: Obstetrics & Gynecology

## 2017-09-08 DIAGNOSIS — Z13220 Encounter for screening for lipoid disorders: Secondary | ICD-10-CM | POA: Diagnosis not present

## 2017-09-08 DIAGNOSIS — R1030 Lower abdominal pain, unspecified: Secondary | ICD-10-CM | POA: Diagnosis not present

## 2017-09-08 DIAGNOSIS — Z86018 Personal history of other benign neoplasm: Secondary | ICD-10-CM | POA: Diagnosis not present

## 2017-09-08 DIAGNOSIS — N921 Excessive and frequent menstruation with irregular cycle: Secondary | ICD-10-CM | POA: Diagnosis not present

## 2017-09-12 ENCOUNTER — Encounter: Payer: Self-pay | Admitting: Obstetrics and Gynecology

## 2017-09-12 ENCOUNTER — Other Ambulatory Visit (HOSPITAL_COMMUNITY)
Admission: RE | Admit: 2017-09-12 | Discharge: 2017-09-12 | Disposition: A | Discharge status: Home / Self Care | Payer: BLUE CROSS/BLUE SHIELD | Source: Ambulatory Visit | Attending: Obstetrics and Gynecology | Admitting: Obstetrics and Gynecology

## 2017-09-12 ENCOUNTER — Ambulatory Visit (INDEPENDENT_AMBULATORY_CARE_PROVIDER_SITE_OTHER): Payer: BLUE CROSS/BLUE SHIELD | Admitting: Obstetrics and Gynecology

## 2017-09-12 ENCOUNTER — Other Ambulatory Visit: Payer: Self-pay

## 2017-09-12 VITALS — BP 110/64 | HR 72 | Resp 14 | Ht 67.0 in | Wt 175.0 lb

## 2017-09-12 DIAGNOSIS — N920 Excessive and frequent menstruation with regular cycle: Secondary | ICD-10-CM

## 2017-09-12 DIAGNOSIS — D500 Iron deficiency anemia secondary to blood loss (chronic): Secondary | ICD-10-CM | POA: Diagnosis not present

## 2017-09-12 DIAGNOSIS — D259 Leiomyoma of uterus, unspecified: Secondary | ICD-10-CM

## 2017-09-12 DIAGNOSIS — N946 Dysmenorrhea, unspecified: Secondary | ICD-10-CM

## 2017-09-12 DIAGNOSIS — Z124 Encounter for screening for malignant neoplasm of cervix: Secondary | ICD-10-CM

## 2017-09-12 DIAGNOSIS — R350 Frequency of micturition: Secondary | ICD-10-CM | POA: Diagnosis not present

## 2017-09-12 DIAGNOSIS — Z01419 Encounter for gynecological examination (general) (routine) without abnormal findings: Secondary | ICD-10-CM

## 2017-09-12 MED ORDER — NAPROXEN SODIUM 550 MG PO TABS: ORAL_TABLET | ORAL | 2 refills | Status: DC

## 2017-09-12 NOTE — Progress Notes (Signed)
37 y.o. [id number] Single African American F here as new patient to establish care.  She was referred by her primary MD for evaluation of heavy cycles and a h/o uterine fibroids. She has regular cycles, very heavy for 5 of the 7 days. She has episodes of gushing and bleeding through her clothes. Cycles have been this heavy since 2003. Cramps are severe for 5 days, takes ibuprofen (4-5 tablets). With the ibuprofen she is able to function. She has some pre-menstrual cramping.    Period Cycle (Days): 30 Period Duration (Days): 7  Period Pattern: Regular Menstrual Flow: Heavy Menstrual Control: Maxi pad Menstrual Control Change Freq (Hours): every hour  Dysmenorrhea: (!) Severe Dysmenorrhea Symptoms: Cramping  She is sexually active, no dyspareunia.  Recent blood work reviewed. Labs were significant for mild anemia with a Hgb of 10.7 gm/dl, normal TSH  Previously has been on OCP's and used a mirena (did well)  Patient's last menstrual period was 08/16/2017.          Sexually active: Yes.    The current method of family planning is tubal ligation.    Exercising: Yes.    treadmill, weights Smoker:  no  Health Maintenance: Pap:  04-10-11 negative, HR HPV negative           2014 with Dr. Carlota Raspberry, WNL per patient History of abnormal Pap:  no MMG:  never Colonoscopy:  never BMD:   never TDaP:  06-07-12  Gardasil: no    reports that she has never smoked. She has never used smokeless tobacco. She reports that she does not drink alcohol or use drugs. Home with her 4 kids (19,17,10, 5). Oldest is going to college.   Past Medical History:  Diagnosis Date  . Anemia   . Fibroid   . Headache(784.0)   . UTI (lower urinary tract infection) 03-24-11   finishing macrobid    Past Surgical History:  Procedure Laterality Date  . CESAREAN SECTION N/A 06/04/2012   Procedure: CESAREAN SECTION;  Surgeon: Jolayne Haines, MD;  Location: [facility];  Service: Obstetrics;  Laterality: N/A;  . DILATION AND EVACUATION   04/15/2011   Procedure: DILATATION AND EVACUATION;  Surgeon: Avel Sensor, MD;  Location: [facility];  Service: Gynecology;  Laterality: N/A;  12wks: Performed by Dr. Carolin Guernsey  . TUBAL LIGATION Bilateral 06/04/2012   Procedure: BILATERAL TUBAL LIGATION;  Surgeon: Jolayne Haines, MD;  Location: [facility];  Service: Obstetrics;  Laterality: Bilateral;  C/S x 1 (last child) with tubal ligation  Current Outpatient Medications  Medication Sig Dispense Refill  . ibuprofen (ADVIL,MOTRIN) 800 MG tablet Take 1 tablet (800 mg total) by mouth every 8 (eight) hours as needed. 30 tablet 5   No current facility-administered medications for this visit.     Family History  Problem Relation Age of Onset  . Breast cancer Maternal Grandmother        maternal grandmother  . Diabetes Mother   . Prostate cancer Father   . Fibroids Sister     Review of Systems  Constitutional: Negative.   HENT: Negative.   Eyes: Negative.   Respiratory: Negative.   Cardiovascular: Negative.   Gastrointestinal: Negative.   Endocrine: Negative.   Genitourinary: Positive for frequency and menstrual problem.  Musculoskeletal: Negative.   Skin: Negative.   Allergic/Immunologic: Negative.   Neurological: Negative.   Hematological: Negative.   She voids frequently, small amounts, some urgency to void.   Exam:   BP 110/64 (BP Location: Right Arm,  Patient Position: Sitting, Cuff Size: Normal)   Pulse 72   Resp 14   Ht 5\' 7"  (1.702 m)   Wt 175 lb (79.4 kg)   LMP 08/16/2017   BMI 27.41 kg/m   Weight change: @WEIGHTCHANGE @ Height:   Height: 5\' 7"  (170.2 cm)  Ht Readings from Last 3 Encounters:  09/12/17 5\' 7"  (1.702 m)  08/17/13 5\' 7"  (1.702 m)  01/15/13 5\' 7"  (1.702 m)    General appearance: alert, cooperative and appears stated age Head: Normocephalic, without obvious abnormality, atraumatic Neck: no adenopathy, supple, symmetrical, trachea midline and thyroid normal to inspection and palpation Lungs: clear to  auscultation bilaterally Cardiovascular: regular rate and rhythm Breasts: normal appearance, no masses or tenderness Abdomen: soft, non-tender; non distended,  no masses,  no organomegaly Extremities: extremities normal, atraumatic, no cyanosis or edema Skin: Skin color, texture, turgor normal. No rashes or lesions Lymph nodes: Cervical, supraclavicular, and axillary nodes normal. No abnormal inguinal nodes palpated Neurologic: Grossly normal   Pelvic: External genitalia:  no lesions              Urethra:  normal appearing urethra with no masses, tenderness or lesions              Bartholins and Skenes: normal                 Vagina: normal appearing vagina with normal color and discharge, no lesions              Cervix: no lesions               Bimanual Exam:  Uterus:  anteverted, irregular, 8 week sized, mobile, mildly tender (does push on her bladder)              Adnexa: no mass, fullness, tenderness               Rectovaginal: Confirms               Anus:  normal sphincter tone, no lesions  Chaperone was present for exam.  A:  Well Woman with normal exam  Menorrhagia, with mild anemia  Dysmenorrhea, severe  Fibroid uterus    P:   Pap with hpv  Discussed breast self exam  Discussed calcium and vit D intake  Return for ultrasound, possible sonohysterogram  Discussed possible treatment options, including: OCP's (risks reviewed), Mirena IUD, possible hysteroscopic myomectomy(depending on u/s results), uterine artery embolization and hysterectomy   Start one iron tablet a day   Anaprox for cramps, can add tylenol if needed  She is leaning towards trying OCP's, information given  Discussed the option of starting the gardasil series, information given  CC: Darreld Mclean, FNP

## 2017-09-12 NOTE — Patient Instructions (Addendum)
EXERCISE AND DIET:  We recommended that you start or continue a regular exercise program for good health. Regular exercise means any activity that makes your heart beat faster and makes you sweat.  We recommend exercising at least 30 minutes per day at least 3 days a week, preferably 4 or 5.  We also recommend a diet low in fat and sugar.  Inactivity, poor dietary choices and obesity can cause diabetes, heart attack, stroke, and kidney damage, among others.    ALCOHOL AND SMOKING:  Women should limit their alcohol intake to no more than 7 drinks/beers/glasses of wine (combined, not each!) per week. Moderation of alcohol intake to this level decreases your risk of breast cancer and liver damage. And of course, no recreational drugs are part of a healthy lifestyle.  And absolutely no smoking or even second hand smoke. Most people know smoking can cause heart and lung diseases, but did you know it also contributes to weakening of your bones? Aging of your skin?  Yellowing of your teeth and nails?  CALCIUM AND VITAMIN D:  Adequate intake of calcium and Vitamin D are recommended.  The recommendations for exact amounts of these supplements seem to change often, but generally speaking 600 mg of calcium (either carbonate or citrate) and 800 units of Vitamin D per day seems prudent. Certain women may benefit from higher intake of Vitamin D.  If you are among these women, your doctor will have told you during your visit.    PAP SMEARS:  Pap smears, to check for cervical cancer or precancers,  have traditionally been done yearly, although recent scientific advances have shown that most women can have pap smears less often.  However, every woman still should have a physical exam from her gynecologist every year. It will include a breast check, inspection of the vulva and vagina to check for abnormal growths or skin changes, a visual exam of the cervix, and then an exam to evaluate the size and shape of the uterus and  ovaries.  And after 40 years of age, a rectal exam is indicated to check for rectal cancers. We will also provide age appropriate advice regarding health maintenance, like when you should have certain vaccines, screening for sexually transmitted diseases, bone density testing, colonoscopy, mammograms, etc.   MAMMOGRAMS:  All women over 40 years old should have a yearly mammogram. Many facilities now offer a "3D" mammogram, which may cost around $50 extra out of pocket. If possible,  we recommend you accept the option to have the 3D mammogram performed.  It both reduces the number of women who will be called back for extra views which then turn out to be normal, and it is better than the routine mammogram at detecting truly abnormal areas.    COLONOSCOPY:  Colonoscopy to screen for colon cancer is recommended for all women at age 50.  We know, you hate the idea of the prep.  We agree, BUT, having colon cancer and not knowing it is worse!!  Colon cancer so often starts as a polyp that can be seen and removed at colonscopy, which can quite literally save your life!  And if your first colonoscopy is normal and you have no family history of colon cancer, most women don't have to have it again for 10 years.  Once every ten years, you can do something that may end up saving your life, right?  We will be happy to help you get it scheduled when you are ready.    Be sure to check your insurance coverage so you understand how much it will cost.  It may be covered as a preventative service at no cost, but you should check your particular policy.      Oral Contraception Information Oral contraceptive pills (OCPs) are medicines taken to prevent pregnancy. OCPs work by preventing the ovaries from releasing eggs. The hormones in OCPs also cause the cervical mucus to thicken, preventing the sperm from entering the uterus. The hormones also cause the uterine lining to become thin, not allowing a fertilized egg to attach to the  inside of the uterus. OCPs are highly effective when taken exactly as prescribed. However, OCPs do not prevent sexually transmitted diseases (STDs). Safe sex practices, such as using condoms along with the pill, can help prevent STDs. Before taking the pill, you may have a physical exam and Pap test. Your health care provider may order blood tests. The health care provider will make sure you are a good candidate for oral contraception. Discuss with your health care provider the possible side effects of the OCP you may be prescribed. When starting an OCP, it can take 2 to 3 months for the body to adjust to the changes in hormone levels in your body. Types of oral contraception  The combination pill-This pill contains estrogen and progestin (synthetic progesterone) hormones. The combination pill comes in 21-day, 28-day, or 91-day packs. Some types of combination pills are meant to be taken continuously (365-day pills). With 21-day packs, you do not take pills for 7 days after the last pill. With 28-day packs, the pill is taken every day. The last 7 pills are without hormones. Certain types of pills have more than 21 hormone-containing pills. With 91-day packs, the first 84 pills contain both hormones, and the last 7 pills contain no hormones or contain estrogen only.  The minipill-This pill contains the progesterone hormone only. The pill is taken every day continuously. It is very important to take the pill at the same time each day. The minipill comes in packs of 28 pills. All 28 pills contain the hormone. Advantages of oral contraceptive pills  Decreases premenstrual symptoms.  Treats menstrual period cramps.  Regulates the menstrual cycle.  Decreases a heavy menstrual flow.  May treatacne, depending on the type of pill.  Treats abnormal uterine bleeding.  Treats polycystic ovarian syndrome.  Treats endometriosis.  Can be used as emergency contraception. Things that can make oral  contraceptive pills less effective OCPs can be less effective if:  You forget to take the pill at the same time every day.  You have a stomach or intestinal disease that lessens the absorption of the pill.  You take OCPs with other medicines that make OCPs less effective, such as antibiotics, certain HIV medicines, and some seizure medicines.  You take expired OCPs.  You forget to restart the pill on day 7, when using the packs of 21 pills.  Risks associated with oral contraceptive pills Oral contraceptive pills can sometimes cause side effects, such as:  Headache.  Nausea.  Breast tenderness.  Irregular bleeding or spotting.  Combination pills are also associated with a small increased risk of:  Blood clots.  Heart attack.  Stroke.  This information is not intended to replace advice given to you by your health care provider. Make sure you discuss any questions you have with your health care provider. Document Released: 05/11/2002 Document Revised: 07/27/2015 Document Reviewed: 08/09/2012 Elsevier Interactive Patient Education  2018 [employer].  Uterine  Fibroids Uterine fibroids are tissue masses (tumors) that can develop in the womb (uterus). They are also called leiomyomas. This type of tumor is not cancerous (benign) and does not spread to other parts of the body outside of the pelvic area, which is between the hip bones. Occasionally, fibroids may develop in the fallopian tubes, in the cervix, or on the support structures (ligaments) that surround the uterus. You can have one or many fibroids. Fibroids can vary in size, weight, and where they grow in the uterus. Some can become quite large. Most fibroids do not require medical treatment. What are the causes? A fibroid can develop when a single uterine cell keeps growing (replicating). Most cells in the human body have a control mechanism that keeps them from replicating without control. What are the signs or  symptoms? Symptoms may include:  Heavy bleeding during your period.  Bleeding or spotting between periods.  Pelvic pain and pressure.  Bladder problems, such as needing to urinate more often (urinary frequency) or urgently.  Inability to reproduce offspring (infertility).  Miscarriages.  How is this diagnosed? Uterine fibroids are diagnosed through a physical exam. Your health care provider may feel the lumpy tumors during a pelvic exam. Ultrasonography and an MRI may be done to determine the size, location, and number of fibroids. How is this treated? Treatment may include:  Watchful waiting. This involves getting the fibroid checked by your health care provider to see if it grows or shrinks. Follow your health care provider's recommendations for how often to have this checked.  Hormone medicines. These can be taken by mouth or given through an intrauterine device (IUD).  Surgery. ? Removing the fibroids (myomectomy) or the uterus (hysterectomy). ? Removing blood supply to the fibroids (uterine artery embolization).  If fibroids interfere with your fertility and you want to become pregnant, your health care provider may recommend having the fibroids removed. Follow these instructions at home:  Keep all follow-up visits as directed by your health care provider. This is important.  Take over-the-counter and prescription medicines only as told by your health care provider. ? If you were prescribed a hormone treatment, take the hormone medicines exactly as directed.  Ask your health care provider about taking iron pills and increasing the amount of dark green, leafy vegetables in your diet. These actions can help to boost your blood iron levels, which may be affected by heavy menstrual bleeding.  Pay close attention to your period and tell your health care provider about any changes, such as: ? Increased blood flow that requires you to use more pads or tampons than usual per  month. ? A change in the number of days that your period lasts per month. ? A change in symptoms that are associated with your period, such as abdominal cramping or back pain. Contact a health care provider if:  You have pelvic pain, back pain, or abdominal cramps that cannot be controlled with medicines.  You have an increase in bleeding between and during periods.  You soak tampons or pads in a half hour or less.  You feel lightheaded, extra tired, or weak. Get help right away if:  You faint.  You have a sudden increase in pelvic pain. This information is not intended to replace advice given to you by your health care provider. Make sure you discuss any questions you have with your health care provider. Document Released: 02/16/2000 Document Revised: 10/19/2015 Document Reviewed: 08/17/2013 Elsevier Interactive Patient Education  [employer].

## 2017-09-13 LAB — URINALYSIS, MICROSCOPIC ONLY
CASTS: NONE SEEN /LPF
RBC, UA: NONE SEEN /hpf (ref 0–2)
WBC, UA: NONE SEEN /hpf (ref 0–5)

## 2017-09-13 LAB — URINE CULTURE

## 2017-09-15 ENCOUNTER — Telehealth: Payer: Self-pay | Admitting: Obstetrics and Gynecology

## 2017-09-15 LAB — CYTOLOGY - PAP
Diagnosis: NEGATIVE
HPV (WINDOPATH): NOT DETECTED

## 2017-09-15 NOTE — Telephone Encounter (Signed)
Spoke with patient regarding benefit for an ultrasound with a possible sonohysterogram. Patient understood and agreeable. Patient advises she will need to call back at a later date to schedule   cc: Lamont Snowball, RN

## 2017-09-18 ENCOUNTER — Telehealth: Payer: Self-pay | Admitting: Obstetrics and Gynecology

## 2017-09-18 MED ORDER — NAPROXEN SODIUM 550 MG PO TABS: ORAL_TABLET | ORAL | 2 refills | Status: AC

## 2017-09-18 NOTE — Telephone Encounter (Signed)
Call placed to patient to follow up in regards to scheduling an ultrasound with possible sonohysterogram. Patient advises she would like to schedule appointment in one month.   Forwarding to Dr Talbert Nan to review   cc: Lamont Snowball, RN

## 2017-09-18 NOTE — Telephone Encounter (Signed)
Call to [facility] x 2 no answer at pharmacy. Call to patient and she confirms that the Naproxen was the prescription that her pharmacy did not have. Reordered Rx to [facility]. Pt will call back if any problems with picking up new Rx.  Encounter closed.

## 2017-09-18 NOTE — Telephone Encounter (Signed)
Patient called and said her pharmacy on file does not have the prescription Dr. Talbert Nan sent in. Patient stated she does not know the name of the medication but did confirm her pharmacy on file is correct.

## 2017-10-13 ENCOUNTER — Telehealth: Payer: Self-pay | Admitting: Obstetrics and Gynecology

## 2017-10-13 DIAGNOSIS — N946 Dysmenorrhea, unspecified: Secondary | ICD-10-CM

## 2017-10-13 NOTE — Telephone Encounter (Signed)
Call placed to patient to follow up and schedule the recommended ultrasound with possible sonohysterogram. Left voicemail message requesting a return call   cc: Lamont Snowball, RN

## 2017-10-15 NOTE — Telephone Encounter (Signed)
Second call placed to follow up and schedule recommended ultrasound with possible sonohysterogram. Left voicemail message requesting a return call   cc: Lamont Snowball, RN

## 2017-10-21 NOTE — Telephone Encounter (Signed)
Left message to call Kaitlyn at [phone]. 

## 2017-10-23 NOTE — Telephone Encounter (Signed)
Call to patient. Per ROI, can leave message on voice mail.  Left message to call back regarding scheduling procedure. Have made multiple attempts to reach her. Requested call back with update.

## 2017-10-23 NOTE — Telephone Encounter (Signed)
Patient returning call to Sally. °

## 2017-10-23 NOTE — Telephone Encounter (Signed)
Patient returned call.   Cc: Sherri Galvan

## 2017-10-23 NOTE — Telephone Encounter (Signed)
Spoke with patient. Patient states she would like to proceed with scheduling PUS, but needs to discuss possibility of payment plan before proceeding. Advised will have business office return call to discuss. Patient agreeable.    Routing to [employer].

## 2017-10-23 NOTE — Telephone Encounter (Signed)
Call to patient. Left message to call back. Advised may speak to triage nurse if I am not available.

## 2017-10-23 NOTE — Telephone Encounter (Signed)
Spoke with patient and advised of benefit information for recommneded PUS. Patient scheduled for 11/04/17 with Dr. Talbert Nan.  Routing to provider for final review. Patient agreeable to disposition. Will close encounter.   Cc. Glorianne Manchester, RN

## 2017-10-31 NOTE — Addendum Note (Signed)
Addended by: Michele Mcalpine on: 10/31/2017 10:43 AM   Modules accepted: Orders

## 2017-11-04 ENCOUNTER — Ambulatory Visit (INDEPENDENT_AMBULATORY_CARE_PROVIDER_SITE_OTHER): Payer: BLUE CROSS/BLUE SHIELD

## 2017-11-04 ENCOUNTER — Other Ambulatory Visit: Payer: Self-pay

## 2017-11-04 ENCOUNTER — Ambulatory Visit (INDEPENDENT_AMBULATORY_CARE_PROVIDER_SITE_OTHER): Payer: BLUE CROSS/BLUE SHIELD | Admitting: Obstetrics and Gynecology

## 2017-11-04 ENCOUNTER — Encounter: Payer: Self-pay | Admitting: Obstetrics and Gynecology

## 2017-11-04 VITALS — BP 118/84 | HR 72 | Resp 14 | Ht 67.0 in | Wt 176.5 lb

## 2017-11-04 DIAGNOSIS — D259 Leiomyoma of uterus, unspecified: Secondary | ICD-10-CM | POA: Diagnosis not present

## 2017-11-04 DIAGNOSIS — N946 Dysmenorrhea, unspecified: Secondary | ICD-10-CM | POA: Diagnosis not present

## 2017-11-04 DIAGNOSIS — N920 Excessive and frequent menstruation with regular cycle: Secondary | ICD-10-CM | POA: Diagnosis not present

## 2017-11-04 MED ORDER — NORETHIN ACE-ETH ESTRAD-FE 1-20 MG-MCG PO TABS: 1.0000 | ORAL_TABLET | Freq: Every day | ORAL | 0 refills | Status: AC

## 2017-11-04 NOTE — Progress Notes (Signed)
GYNECOLOGY  VISIT   HPI: 37 y.o.   Single  African American  female   [phone] with Patient's last menstrual period was 10/15/2017.   here for further evaluation of menorrhagia and a h/o a fibroid uterus.  She was started on anaprox for cramps at her annual exam in 7/19 and this is helping. She has mild anemia, she is eating more iron in her diet, not taking iron.   GYNECOLOGIC HISTORY: Patient's last menstrual period was 10/15/2017. Contraception: tubal ligation Menopausal hormone therapy: none         OB History    Gravida  5   Para  4   Term  1   Preterm      AB  1   Living  4     SAB      TAB      Ectopic      Multiple      Live Births  4              Patient Active Problem List   Diagnosis Date Noted  . Dysmenorrhea 09/23/2013    Past Medical History:  Diagnosis Date  . Anemia   . Fibroid   . Headache(784.0)   . UTI (lower urinary tract infection) 03-24-11   finishing macrobid    Past Surgical History:  Procedure Laterality Date  . CESAREAN SECTION N/A 06/04/2012   Procedure: CESAREAN SECTION;  Surgeon: Jolayne Haines, MD;  Location: [facility];  Service: Obstetrics;  Laterality: N/A;  . DILATION AND EVACUATION  04/15/2011   Procedure: DILATATION AND EVACUATION;  Surgeon: Avel Sensor, MD;  Location: [facility];  Service: Gynecology;  Laterality: N/A;  12wks: Performed by Dr. Carolin Guernsey  . TUBAL LIGATION Bilateral 06/04/2012   Procedure: BILATERAL TUBAL LIGATION;  Surgeon: Jolayne Haines, MD;  Location: [facility];  Service: Obstetrics;  Laterality: Bilateral;    Current Outpatient Medications  Medication Sig Dispense Refill  . ibuprofen (ADVIL,MOTRIN) 800 MG tablet Take 1 tablet (800 mg total) by mouth every 8 (eight) hours as needed. 30 tablet 5  . naproxen sodium (ANAPROX DS) 550 MG tablet 1 tablet po q 12 hours prn cramps 30 tablet 2   No current facility-administered medications for this visit.      ALLERGIES: Patient has no known  allergies.  Family History  Problem Relation Age of Onset  . Breast cancer Maternal Grandmother        maternal grandmother  . Diabetes Mother   . Prostate cancer Father   . Fibroids Sister     Social History   Socioeconomic History  . Marital status: Single    Spouse name: Not on file  . Number of children: 3  . Years of education: Not on file  . Highest education level: Not on file  Occupational History  . Not on file  Social Needs  . Financial resource strain: Not on file  . Food insecurity:    Worry: Not on file    Inability: Not on file  . Transportation needs:    Medical: Not on file    Non-medical: Not on file  Tobacco Use  . Smoking status: Never Smoker  . Smokeless tobacco: Never Used  Substance and Sexual Activity  . Alcohol use: No  . Drug use: No  . Sexual activity: Yes    Birth control/protection: Surgical    Comment: BTL  Lifestyle  . Physical activity:    Days per week: Not  on file    Minutes per session: Not on file  . Stress: Not on file  Relationships  . Social connections:    Talks on phone: Not on file    Gets together: Not on file    Attends religious service: Not on file    Active member of club or organization: Not on file    Attends meetings of clubs or organizations: Not on file    Relationship status: Not on file  . Intimate partner violence:    Fear of current or ex partner: Not on file    Emotionally abused: Not on file    Physically abused: Not on file    Forced sexual activity: Not on file  Other Topics Concern  . Not on file  Social History Narrative   Denies tobacco ethanol or dope use to include crack, heroin or cocaine     Review of Systems  Constitutional: Negative.   HENT: Negative.   Eyes: Negative.   Respiratory: Negative.   Cardiovascular: Negative.   Gastrointestinal: Negative.   Genitourinary: Negative.   Musculoskeletal: Negative.   Skin: Negative.   Neurological: Negative.   Endo/Heme/Allergies:  Negative.   Psychiatric/Behavioral: Negative.     PHYSICAL EXAMINATION:    BP 118/84 (BP Location: Left Arm, Patient Position: Sitting, Cuff Size: Normal)   Pulse 72   Resp 14   Ht 5\' 7"  (1.702 m)   Wt 176 lb 8 oz (80.1 kg)   LMP 10/15/2017   BMI 27.64 kg/m     General appearance: alert, cooperative and appears stated age  Ultrasound images reviewed with the patient  ASSESSMENT Fibroid uterus, no fibroids appear to be distorting her cavity Menorrhagia with mild anemia    PLAN Discussed options for treatment, including OCP's, depo-provera, and the mirena IUD She would like to try the pill No contraindications to OCP's, risks reviewed Will start the pill, f/u in 3 months Continue to eat iron in her diet Will check a CBC and ferritin in 3 months   An After Visit Summary was printed and given to the patient.  ~15 minutes face to face time of which over 50% was spent in counseling.

## 2017-11-04 NOTE — Patient Instructions (Signed)
Oral Contraception Information Oral contraceptive pills (OCPs) are medicines taken to prevent pregnancy. OCPs work by preventing the ovaries from releasing eggs. The hormones in OCPs also cause the cervical mucus to thicken, preventing the sperm from entering the uterus. The hormones also cause the uterine lining to become thin, not allowing a fertilized egg to attach to the inside of the uterus. OCPs are highly effective when taken exactly as prescribed. However, OCPs do not prevent sexually transmitted diseases (STDs). Safe sex practices, such as using condoms along with the pill, can help prevent STDs. Before taking the pill, you may have a physical exam and Pap test. Your health care provider may order blood tests. The health care provider will make sure you are a good candidate for oral contraception. Discuss with your health care provider the possible side effects of the OCP you may be prescribed. When starting an OCP, it can take 2 to 3 months for the body to adjust to the changes in hormone levels in your body. Types of oral contraception  The combination pill-This pill contains estrogen and progestin (synthetic progesterone) hormones. The combination pill comes in 21-day, 28-day, or 91-day packs. Some types of combination pills are meant to be taken continuously (365-day pills). With 21-day packs, you do not take pills for 7 days after the last pill. With 28-day packs, the pill is taken every day. The last 7 pills are without hormones. Certain types of pills have more than 21 hormone-containing pills. With 91-day packs, the first 84 pills contain both hormones, and the last 7 pills contain no hormones or contain estrogen only.  The minipill-This pill contains the progesterone hormone only. The pill is taken every day continuously. It is very important to take the pill at the same time each day. The minipill comes in packs of 28 pills. All 28 pills contain the hormone. Advantages of oral  contraceptive pills  Decreases premenstrual symptoms.  Treats menstrual period cramps.  Regulates the menstrual cycle.  Decreases a heavy menstrual flow.  May treatacne, depending on the type of pill.  Treats abnormal uterine bleeding.  Treats polycystic ovarian syndrome.  Treats endometriosis.  Can be used as emergency contraception. Things that can make oral contraceptive pills less effective OCPs can be less effective if:  You forget to take the pill at the same time every day.  You have a stomach or intestinal disease that lessens the absorption of the pill.  You take OCPs with other medicines that make OCPs less effective, such as antibiotics, certain HIV medicines, and some seizure medicines.  You take expired OCPs.  You forget to restart the pill on day 7, when using the packs of 21 pills.  Risks associated with oral contraceptive pills Oral contraceptive pills can sometimes cause side effects, such as:  Headache.  Nausea.  Breast tenderness.  Irregular bleeding or spotting.  Combination pills are also associated with a small increased risk of:  Blood clots.  Heart attack.  Stroke.  This information is not intended to replace advice given to you by your health care provider. Make sure you discuss any questions you have with your health care provider. Document Released: 05/11/2002 Document Revised: 07/27/2015 Document Reviewed: 08/09/2012 Elsevier Interactive Patient Education  2018 Elsevier Inc.  

## 2018-02-02 NOTE — Progress Notes (Deleted)
GYNECOLOGY  VISIT   HPI: 37 y.o.   Single Black or African American Not Hispanic or Latino  female   [phone] with No LMP recorded.   here for 3 month recheck on OCP.     GYNECOLOGIC HISTORY: No LMP recorded. Contraception:*** Menopausal hormone therapy: ***        OB History    Gravida  5   Para  4   Term  1   Preterm      AB  1   Living  4     SAB      TAB      Ectopic      Multiple      Live Births  4              Patient Active Problem List   Diagnosis Date Noted  . Dysmenorrhea 09/23/2013    Past Medical History:  Diagnosis Date  . Anemia   . Fibroid   . Headache(784.0)   . UTI (lower urinary tract infection) 03-24-11   finishing macrobid    Past Surgical History:  Procedure Laterality Date  . CESAREAN SECTION N/A 06/04/2012   Procedure: CESAREAN SECTION;  Surgeon: Jolayne Haines, MD;  Location: [facility];  Service: Obstetrics;  Laterality: N/A;  . DILATION AND EVACUATION  04/15/2011   Procedure: DILATATION AND EVACUATION;  Surgeon: Avel Sensor, MD;  Location: [facility];  Service: Gynecology;  Laterality: N/A;  12wks: Performed by Dr. Carolin Guernsey  . TUBAL LIGATION Bilateral 06/04/2012   Procedure: BILATERAL TUBAL LIGATION;  Surgeon: Jolayne Haines, MD;  Location: [facility];  Service: Obstetrics;  Laterality: Bilateral;    Current Outpatient Medications  Medication Sig Dispense Refill  . ibuprofen (ADVIL,MOTRIN) 800 MG tablet Take 1 tablet (800 mg total) by mouth every 8 (eight) hours as needed. 30 tablet 5  . naproxen sodium (ANAPROX DS) 550 MG tablet 1 tablet po q 12 hours prn cramps 30 tablet 2  . norethindrone-ethinyl estradiol (JUNEL FE,GILDESS FE,LOESTRIN FE) 1-20 MG-MCG tablet Take 1 tablet by mouth daily. 3 Package 0   No current facility-administered medications for this visit.      ALLERGIES: Patient has no known allergies.  Family History  Problem Relation Age of Onset  . Breast cancer Maternal Grandmother        maternal grandmother   . Diabetes Mother   . Prostate cancer Father   . Fibroids Sister     Social History   Socioeconomic History  . Marital status: Single    Spouse name: Not on file  . Number of children: 3  . Years of education: Not on file  . Highest education level: Not on file  Occupational History  . Not on file  Social Needs  . Financial resource strain: Not on file  . Food insecurity:    Worry: Not on file    Inability: Not on file  . Transportation needs:    Medical: Not on file    Non-medical: Not on file  Tobacco Use  . Smoking status: Never Smoker  . Smokeless tobacco: Never Used  Substance and Sexual Activity  . Alcohol use: No  . Drug use: No  . Sexual activity: Yes    Birth control/protection: Surgical    Comment: BTL  Lifestyle  . Physical activity:    Days per week: Not on file    Minutes per session: Not on file  . Stress: Not on file  Relationships  . Social  connections:    Talks on phone: Not on file    Gets together: Not on file    Attends religious service: Not on file    Active member of club or organization: Not on file    Attends meetings of clubs or organizations: Not on file    Relationship status: Not on file  . Intimate partner violence:    Fear of current or ex partner: Not on file    Emotionally abused: Not on file    Physically abused: Not on file    Forced sexual activity: Not on file  Other Topics Concern  . Not on file  Social History Narrative   Denies tobacco ethanol or dope use to include crack, heroin or cocaine     ROS  PHYSICAL EXAMINATION:    There were no vitals taken for this visit.    General appearance: alert, cooperative and appears stated age Neck: no adenopathy, supple, symmetrical, trachea midline and thyroid {CHL AMB PHY EX THYROID NORM [phone]::"normal to inspection and palpation"} Breasts: {Exam; breast:13139::"normal appearance, no masses or tenderness"} Abdomen: soft, non-tender; non distended, no masses,   no organomegaly  Pelvic: External genitalia:  no lesions              Urethra:  normal appearing urethra with no masses, tenderness or lesions              Bartholins and Skenes: normal                 Vagina: normal appearing vagina with normal color and discharge, no lesions              Cervix: {CHL AMB PHY EX CERVIX NORM [phone]::"no lesions"}              Bimanual Exam:  Uterus:  {CHL AMB PHY EX UTERUS NORM DEFAULT:[phone]::"normal size, contour, position, consistency, mobility, non-tender"}              Adnexa: {CHL AMB PHY EX ADNEXA NO MASS [phone]::"no mass, fullness, tenderness"}              Rectovaginal: {yes no:[id number]}.  Confirms.              Anus:  normal sphincter tone, no lesions  Chaperone was present for exam.  ASSESSMENT     PLAN    An After Visit Summary was printed and given to the patient.  *** minutes face to face time of which over 50% was spent in counseling.

## 2018-02-04 ENCOUNTER — Encounter: Payer: Self-pay | Admitting: Obstetrics and Gynecology

## 2018-02-04 ENCOUNTER — Telehealth: Payer: Self-pay | Admitting: Obstetrics and Gynecology

## 2018-02-04 ENCOUNTER — Ambulatory Visit: Payer: BLUE CROSS/BLUE SHIELD | Admitting: Obstetrics and Gynecology

## 2018-02-04 NOTE — Telephone Encounter (Signed)
Patient Sherri Galvan her 3 month recheck appointment today. I left a message for her to call and reschedule. [facility] policy followed.

## 2018-02-16 NOTE — Telephone Encounter (Signed)
I left another message for the patient to call and reschedule her 3 month follow up appointment. Okay to close encounter?

## 2019-01-08 DIAGNOSIS — N921 Excessive and frequent menstruation with irregular cycle: Secondary | ICD-10-CM | POA: Diagnosis not present

## 2019-01-22 DIAGNOSIS — D259 Leiomyoma of uterus, unspecified: Secondary | ICD-10-CM | POA: Diagnosis not present

## 2019-10-19 DIAGNOSIS — N920 Excessive and frequent menstruation with regular cycle: Secondary | ICD-10-CM | POA: Diagnosis not present

## 2019-10-19 DIAGNOSIS — D259 Leiomyoma of uterus, unspecified: Secondary | ICD-10-CM | POA: Diagnosis not present

## 2019-10-19 DIAGNOSIS — N3946 Mixed incontinence: Secondary | ICD-10-CM | POA: Diagnosis not present

## 2019-11-02 ENCOUNTER — Other Ambulatory Visit: Payer: Self-pay | Admitting: Obstetrics and Gynecology

## 2019-11-02 DIAGNOSIS — Z1231 Encounter for screening mammogram for malignant neoplasm of breast: Secondary | ICD-10-CM

## 2019-11-16 DIAGNOSIS — D259 Leiomyoma of uterus, unspecified: Secondary | ICD-10-CM | POA: Diagnosis not present

## 2019-11-16 DIAGNOSIS — N946 Dysmenorrhea, unspecified: Secondary | ICD-10-CM | POA: Diagnosis not present

## 2019-11-16 DIAGNOSIS — N920 Excessive and frequent menstruation with regular cycle: Secondary | ICD-10-CM | POA: Diagnosis not present

## 2019-11-22 ENCOUNTER — Other Ambulatory Visit: Payer: Self-pay | Admitting: Obstetrics and Gynecology

## 2019-11-22 DIAGNOSIS — D259 Leiomyoma of uterus, unspecified: Secondary | ICD-10-CM

## 2019-11-30 ENCOUNTER — Other Ambulatory Visit: Payer: Self-pay

## 2019-11-30 ENCOUNTER — Ambulatory Visit
Admission: RE | Admit: 2019-11-30 | Discharge: 2019-11-30 | Disposition: A | Discharge status: Home / Self Care | Payer: BC Managed Care – PPO | Source: Ambulatory Visit | Attending: Obstetrics and Gynecology | Admitting: Obstetrics and Gynecology

## 2019-11-30 DIAGNOSIS — Z1231 Encounter for screening mammogram for malignant neoplasm of breast: Secondary | ICD-10-CM

## 2019-12-07 ENCOUNTER — Ambulatory Visit
Admission: RE | Admit: 2019-12-07 | Discharge: 2019-12-07 | Disposition: A | Discharge status: Home / Self Care | Payer: BLUE CROSS/BLUE SHIELD | Source: Ambulatory Visit | Attending: Obstetrics and Gynecology | Admitting: Obstetrics and Gynecology

## 2019-12-07 ENCOUNTER — Other Ambulatory Visit: Payer: Self-pay

## 2019-12-07 ENCOUNTER — Encounter: Payer: Self-pay | Admitting: *Deleted

## 2019-12-07 DIAGNOSIS — D259 Leiomyoma of uterus, unspecified: Secondary | ICD-10-CM | POA: Diagnosis not present

## 2019-12-07 HISTORY — PX: IR RADIOLOGIST EVAL & MGMT: IMG5224

## 2019-12-07 NOTE — Consult Note (Addendum)
I spent a total of  40 Minutes   in remote  clinical consultation, greater than 50% of which was counseling/coordinating care for symptomatic uterine fibroids, possible uterine artery embolization.    Visit type: Audio and video (webex).   Alternative for in-person consultation at [facility], [address], [place], [place]. This visit type was conducted due to national recommendations for restrictions regarding the COVID-19 Pandemic (e.g. social distancing).  This format is felt to be most appropriate for this patient at this time.  All issues noted in this document were discussed and addressed.       Chief Complaint: Symptomatic Uterine Fibroids  Referring Physician(s): Cole,Tara  History of Present Illness: Sherri Galvan is a 39 y.o. female presenting today as a scheduled consultation for symptomatic uterine fibroids, kindly referred by Dr. Christophe Louis.  She joins [place] today by telemedicine, given the current COVID situation.  We confirmed her identity with 2 identifiers.   She complains of all 3 of 3 categories of symptoms of fibroids, bleeding, bulk and pain.   She tells me that regarding her bleeding symptoms, she has had worsening over the past few years.  Her bleeding cycles are typically monthly during her regular periods, and have been getting progressively worse.  She complains of heavy bleeding with clotting, with multiple pad/tampon changes required.  These are typically 7 days, with the first 5 or 6 days being the worst.    She has never required a blood transfusion.  Regarding pain, she describes cramping pain leading up to her period, starting about 2 weeks before.  Frequently she has significant crampy pain at the start of her period as well.    Regarding bulk symptoms, she says she has frequent urination, particularly at night, as well as occasional pain with moving her bowels. .   She has 4 children, and says that she has no intention of further  pregnancy.   Her PCP is Dr. Landry Mellow.  She is treated for no other chronic medical conditions.   Past Medical History:  Diagnosis Date  . Anemia   . Fibroid   . Headache(784.0)   . UTI (lower urinary tract infection) 03-24-11   finishing macrobid    Past Surgical History:  Procedure Laterality Date  . CESAREAN SECTION N/A 06/04/2012   Procedure: CESAREAN SECTION;  Surgeon: Jolayne Haines, MD;  Location: [facility];  Service: Obstetrics;  Laterality: N/A;  . DILATION AND EVACUATION  04/15/2011   Procedure: DILATATION AND EVACUATION;  Surgeon: Avel Sensor, MD;  Location: [facility];  Service: Gynecology;  Laterality: N/A;  12wks: Performed by Dr. Carolin Guernsey  . TUBAL LIGATION Bilateral 06/04/2012   Procedure: BILATERAL TUBAL LIGATION;  Surgeon: Jolayne Haines, MD;  Location: [facility];  Service: Obstetrics;  Laterality: Bilateral;    Allergies: Patient has no known allergies.  Medications: Prior to Admission medications   Medication Sig Start Date End Date Taking? Authorizing Provider  ibuprofen (ADVIL,MOTRIN) 800 MG tablet Take 1 tablet (800 mg total) by mouth every 8 (eight) hours as needed. 09/21/13   Shelly Bombard, MD  naproxen sodium Linna Hoff DS) 550 MG tablet 1 tablet po q 12 hours prn cramps 09/18/17   Salvadore Dom, MD  norethindrone-ethinyl estradiol (JUNEL FE,GILDESS FE,LOESTRIN FE) 1-20 MG-MCG tablet Take 1 tablet by mouth daily. 11/04/17   Salvadore Dom, MD     Family History  Problem Relation Age of Onset  . Breast cancer Maternal Grandmother  maternal grandmother  . Diabetes Mother   . Prostate cancer Father   . Fibroids Sister     Social History   Socioeconomic History  . Marital status: Single    Spouse name: Not on file  . Number of children: 3  . Years of education: Not on file  . Highest education level: Not on file  Occupational History  . Not on file  Tobacco Use  . Smoking status: Never Smoker  . Smokeless tobacco: Never Used  Vaping  Use  . Vaping Use: Never used  Substance and Sexual Activity  . Alcohol use: No  . Drug use: No  . Sexual activity: Yes    Birth control/protection: Surgical    Comment: BTL  Other Topics Concern  . Not on file  Social History Narrative   Denies tobacco ethanol or dope use to include crack, heroin or cocaine    Social Determinants of Health   Financial Resource Strain:   . Difficulty of Paying Living Expenses: Not on file  Food Insecurity:   . Worried About Charity fundraiser in the Last Year: Not on file  . Ran Out of Food in the Last Year: Not on file  Transportation Needs:   . Lack of Transportation (Medical): Not on file  . Lack of Transportation (Non-Medical): Not on file  Physical Activity:   . Days of Exercise per Week: Not on file  . Minutes of Exercise per Session: Not on file  Stress:   . Feeling of Stress : Not on file  Social Connections:   . Frequency of Communication with Friends and Family: Not on file  . Frequency of Social Gatherings with Friends and Family: Not on file  . Attends Religious Services: Not on file  . Active Member of Clubs or Organizations: Not on file  . Attends Archivist Meetings: Not on file  . Marital Status: Not on file       Review of Systems: A 12 point ROS discussed and pertinent positives are indicated in the HPI above.  All other systems are negative.  Review of Systems  Vital Signs: LMP 11/30/2019   Physical Exam  Mallampati Score:     Imaging: MM 3D SCREEN BREAST BILATERAL  Result Date: 12/01/2019 CLINICAL DATA:  Screening. EXAM: DIGITAL SCREENING BILATERAL MAMMOGRAM WITH TOMO AND CAD COMPARISON:  None. ACR Breast Density Category c: The breast tissue is heterogeneously dense, which may obscure small masses FINDINGS: There are no findings suspicious for malignancy. Images were processed with CAD. IMPRESSION: No mammographic evidence of malignancy. A result letter of this screening mammogram will be mailed  directly to the patient. RECOMMENDATION: Screening mammogram in one year. (Code:SM-B-01Y) BI-RADS CATEGORY  1: Negative. Electronically Signed   By: Claudie Revering M.D.   On: 12/01/2019 13:09    Labs:  CBC: No results for input(s): WBC, HGB, HCT, PLT in the last 8760 hours.  COAGS: No results for input(s): INR, APTT in the last 8760 hours.  BMP: No results for input(s): NA, K, CL, CO2, GLUCOSE, BUN, CALCIUM, CREATININE, GFRNONAA, GFRAA in the last 8760 hours.  Invalid input(s): CMP  LIVER FUNCTION TESTS: No results for input(s): BILITOT, AST, ALT, ALKPHOS, PROT, ALBUMIN in the last 8760 hours.  TUMOR MARKERS: No results for input(s): AFPTM, CEA, CA199, CHROMGRNA in the last 8760 hours.  Assessment and Plan:  Ms Sherri Galvan is a 39 yo female with symptomatic uterine fibroids, and is a candidate for uterine artery embolization.  I had a lengthy discussion with her regarding the anatomy, pathology, and treatment options for fibroids.  Specifically, I mentioned hormonal therapy, surgical hysterectomy/myomectomy, uterine fibroid embolization, and HIFU.    Regarding UFE, we had a discussion regarding the efficacy, expectations regarding outcomes/long term efficacy, and risk/benefit.   I shared with her a summary of the literature for UFE efficacy, which generally is accepted to be adequate symptom relief with good restoration of quality of life at 1 year in 90% or greater of patients for bleeding>pain> bulk symptoms.  I also told her that there is a cited rate of retreatment in ~15-20% of patients in the long term, with overall excellent long term utility of symptom relief and QOL.    We discussed the expectations not just for the treatment day/23 hour observation, but the first 3 months, 6 months - 12 months, and our follow up.   We briefly discussed superior hypogastric nerve block as adjunct.  Regarding risks, specific risks discussed include: post-embolization syndrome, bleeding, infection,  contrast reaction, kidney/artery injury, need for further surgery/procedure, including hysterectomy, need for hospitalization, cardiopulmonary collapse, death.    Regarding post-embolization syndrome, I did let her know that this is essentially expected, with a typical prodromal syndrome lasting 4-7 days typically, and usually treated with medication/support such as hydration, rest, analgesics, PO nausea medications, and stool softeners.   We also discussed the need for MRI and possibility that she may be excluded by findings, or possibly requiring a follow up conversation if we feel that adenomyosis is present/contributing.  After discussing, she would like to discuss with her husband, but she feels that she would probably like to proceed with treatment. Her intention would be to be treated soon before the holidays.   Plan: - She is going to discuss with her husband, and then call our office back.   - In the case that she would like to proceed, we would order MRI to evaluate uterus/fibroids, and then plan to proceed with uterine artery embolization and possible superior hypogastric nerve block at first available, with Dr. Earleen Newport.     Thank you for this interesting consult.  I greatly enjoyed meeting Sherri Galvan and look forward to participating in their care.  A copy of this report was sent to the requesting provider on this date.  Electronically Signed: Corrie Mckusick 12/07/2019, 10:19 AM   I spent a total of  60 Minutes   in face to face in clinical consultation, greater than 50% of which was counseling/coordinating care for symptomatic uterine fibroids, possible uterine artery embolization

## 2020-01-04 DIAGNOSIS — D259 Leiomyoma of uterus, unspecified: Secondary | ICD-10-CM | POA: Diagnosis not present

## 2020-01-04 DIAGNOSIS — D649 Anemia, unspecified: Secondary | ICD-10-CM | POA: Diagnosis not present

## 2020-01-04 DIAGNOSIS — K921 Melena: Secondary | ICD-10-CM | POA: Diagnosis not present

## 2020-01-04 DIAGNOSIS — K5900 Constipation, unspecified: Secondary | ICD-10-CM | POA: Diagnosis not present

## 2020-08-22 DIAGNOSIS — L700 Acne vulgaris: Secondary | ICD-10-CM | POA: Diagnosis not present

## 2021-11-23 DIAGNOSIS — D649 Anemia, unspecified: Secondary | ICD-10-CM | POA: Diagnosis not present

## 2021-11-23 DIAGNOSIS — K625 Hemorrhage of anus and rectum: Secondary | ICD-10-CM | POA: Diagnosis not present

## 2021-11-23 DIAGNOSIS — R1900 Intra-abdominal and pelvic swelling, mass and lump, unspecified site: Secondary | ICD-10-CM | POA: Diagnosis not present

## 2021-11-23 DIAGNOSIS — R198 Other specified symptoms and signs involving the digestive system and abdomen: Secondary | ICD-10-CM | POA: Diagnosis not present

## 2021-11-23 DIAGNOSIS — R634 Abnormal weight loss: Secondary | ICD-10-CM | POA: Diagnosis not present

## 2021-12-05 DIAGNOSIS — D509 Iron deficiency anemia, unspecified: Secondary | ICD-10-CM | POA: Diagnosis not present

## 2021-12-05 DIAGNOSIS — K922 Gastrointestinal hemorrhage, unspecified: Secondary | ICD-10-CM | POA: Diagnosis not present

## 2021-12-06 DIAGNOSIS — N921 Excessive and frequent menstruation with irregular cycle: Secondary | ICD-10-CM | POA: Diagnosis not present

## 2021-12-06 DIAGNOSIS — R1900 Intra-abdominal and pelvic swelling, mass and lump, unspecified site: Secondary | ICD-10-CM | POA: Diagnosis not present

## 2021-12-06 DIAGNOSIS — R634 Abnormal weight loss: Secondary | ICD-10-CM | POA: Diagnosis not present

## 2021-12-06 DIAGNOSIS — N946 Dysmenorrhea, unspecified: Secondary | ICD-10-CM | POA: Diagnosis not present

## 2021-12-06 DIAGNOSIS — D509 Iron deficiency anemia, unspecified: Secondary | ICD-10-CM | POA: Diagnosis not present

## 2021-12-06 DIAGNOSIS — D251 Intramural leiomyoma of uterus: Secondary | ICD-10-CM | POA: Diagnosis not present

## 2021-12-06 DIAGNOSIS — D252 Subserosal leiomyoma of uterus: Secondary | ICD-10-CM | POA: Diagnosis not present

## 2021-12-06 DIAGNOSIS — R198 Other specified symptoms and signs involving the digestive system and abdomen: Secondary | ICD-10-CM | POA: Diagnosis not present

## 2021-12-06 DIAGNOSIS — D259 Leiomyoma of uterus, unspecified: Secondary | ICD-10-CM | POA: Diagnosis not present

## 2021-12-06 DIAGNOSIS — R3129 Other microscopic hematuria: Secondary | ICD-10-CM | POA: Diagnosis not present

## 2021-12-06 DIAGNOSIS — K625 Hemorrhage of anus and rectum: Secondary | ICD-10-CM | POA: Diagnosis not present

## 2021-12-14 DIAGNOSIS — D509 Iron deficiency anemia, unspecified: Secondary | ICD-10-CM | POA: Diagnosis not present

## 2021-12-16 DIAGNOSIS — D509 Iron deficiency anemia, unspecified: Secondary | ICD-10-CM | POA: Diagnosis not present

## 2021-12-20 DIAGNOSIS — D252 Subserosal leiomyoma of uterus: Secondary | ICD-10-CM | POA: Diagnosis not present

## 2021-12-20 DIAGNOSIS — N921 Excessive and frequent menstruation with irregular cycle: Secondary | ICD-10-CM | POA: Diagnosis not present

## 2021-12-20 DIAGNOSIS — N946 Dysmenorrhea, unspecified: Secondary | ICD-10-CM | POA: Diagnosis not present

## 2021-12-20 DIAGNOSIS — D251 Intramural leiomyoma of uterus: Secondary | ICD-10-CM | POA: Diagnosis not present

## 2022-01-29 DIAGNOSIS — K2289 Other specified disease of esophagus: Secondary | ICD-10-CM | POA: Diagnosis not present

## 2022-01-29 DIAGNOSIS — D509 Iron deficiency anemia, unspecified: Secondary | ICD-10-CM | POA: Diagnosis not present

## 2022-01-29 DIAGNOSIS — B3781 Candidal esophagitis: Secondary | ICD-10-CM | POA: Diagnosis not present

## 2022-02-18 DIAGNOSIS — Z9851 Tubal ligation status: Secondary | ICD-10-CM | POA: Diagnosis not present

## 2022-02-18 DIAGNOSIS — Z98891 History of uterine scar from previous surgery: Secondary | ICD-10-CM | POA: Diagnosis not present

## 2022-02-18 DIAGNOSIS — D259 Leiomyoma of uterus, unspecified: Secondary | ICD-10-CM | POA: Diagnosis not present

## 2022-02-18 DIAGNOSIS — Z302 Encounter for sterilization: Secondary | ICD-10-CM | POA: Diagnosis not present

## 2022-02-18 DIAGNOSIS — Z79899 Other long term (current) drug therapy: Secondary | ICD-10-CM | POA: Diagnosis not present

## 2022-02-18 DIAGNOSIS — N921 Excessive and frequent menstruation with irregular cycle: Secondary | ICD-10-CM | POA: Diagnosis not present

## 2022-02-18 DIAGNOSIS — D251 Intramural leiomyoma of uterus: Secondary | ICD-10-CM | POA: Diagnosis not present

## 2022-02-18 DIAGNOSIS — N946 Dysmenorrhea, unspecified: Secondary | ICD-10-CM | POA: Diagnosis not present

## 2022-06-02 IMAGING — MG DIGITAL SCREENING BILAT W/ TOMO W/ CAD
8 series · 8 of 24 positions shown · non-contrast
Comparison: None.

CLINICAL DATA: Screening.

EXAM:
DIGITAL SCREENING BILATERAL MAMMOGRAM WITH TOMO AND CAD

[R CC synth-2D]
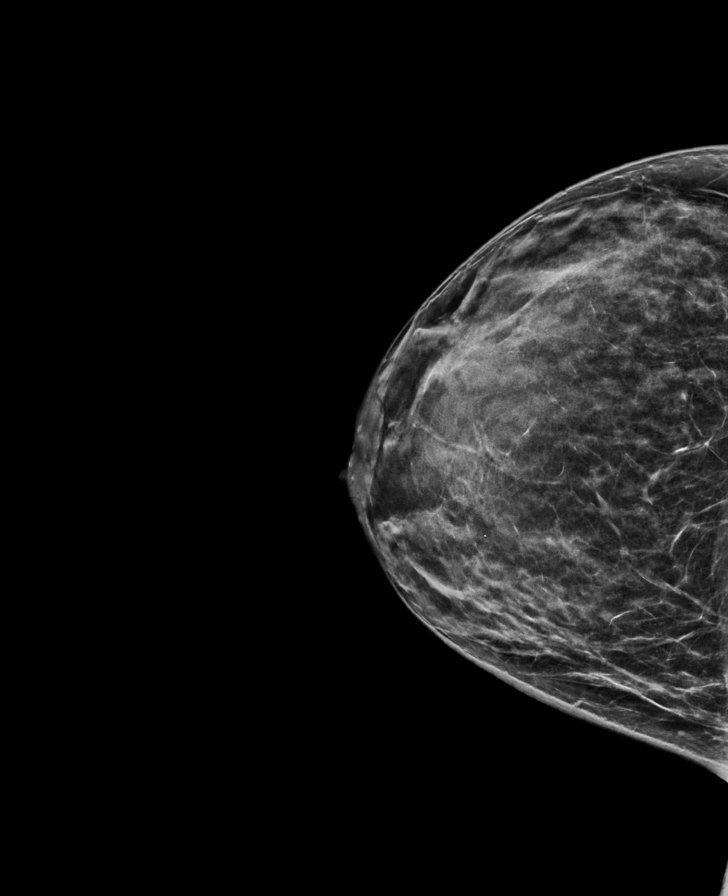

[L MLO synth-2D]
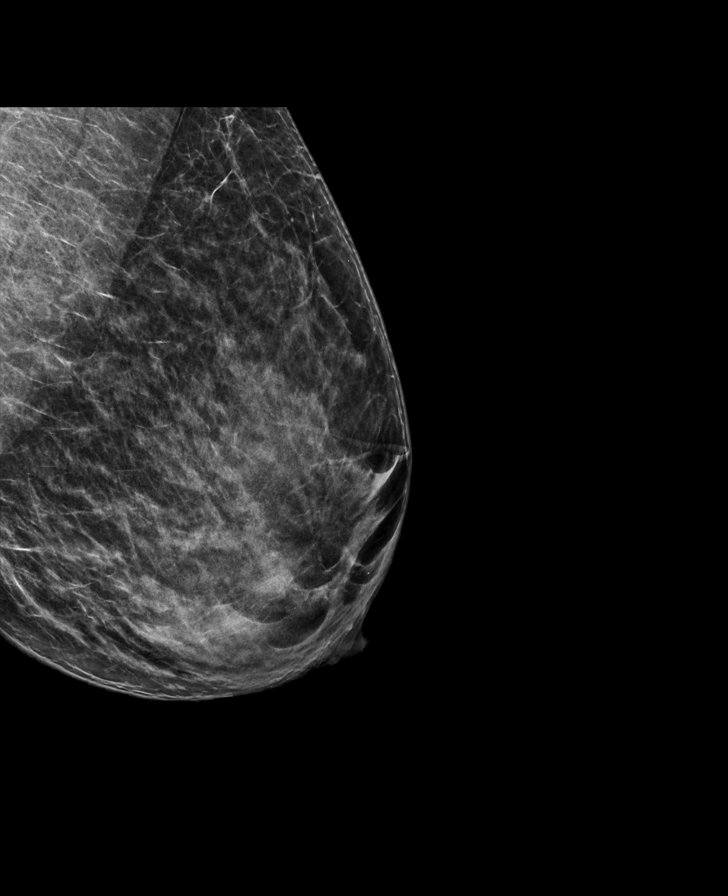

[L CC synth-2D]
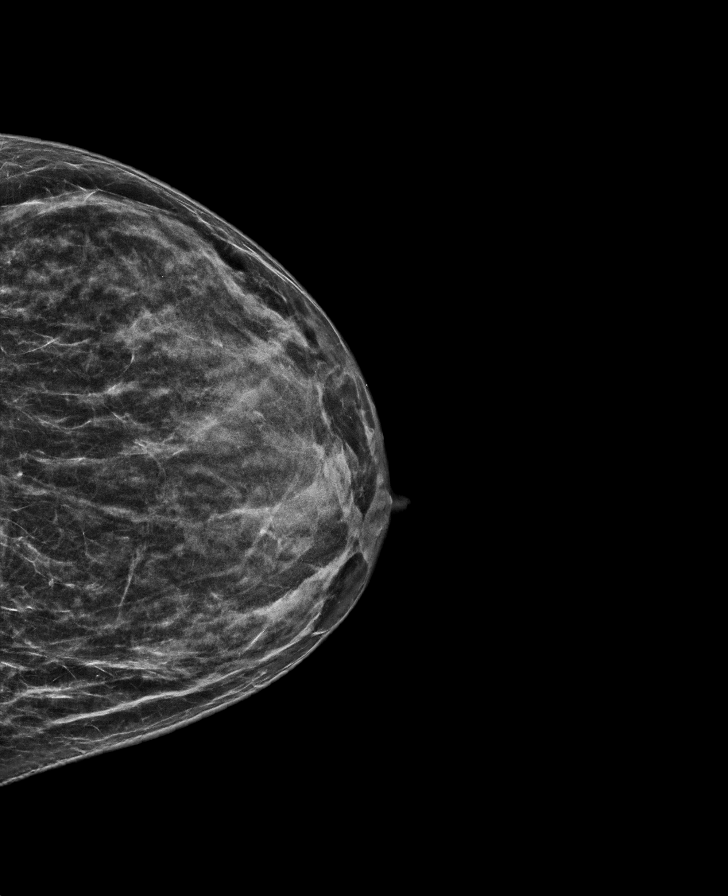

[R MLO synth-2D]
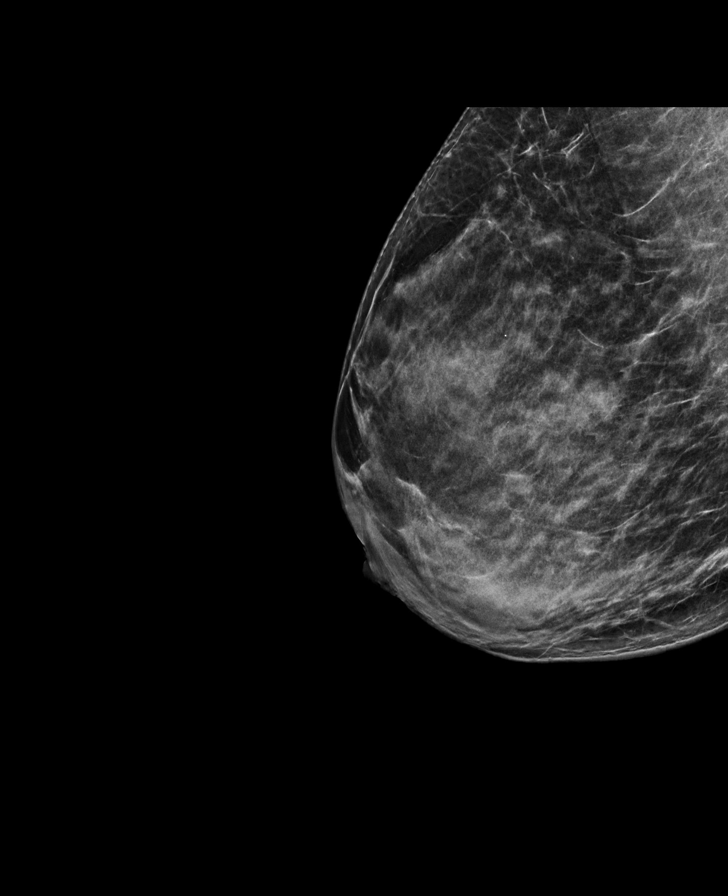

[R MLO tomo · tomo slice 31/61.0]
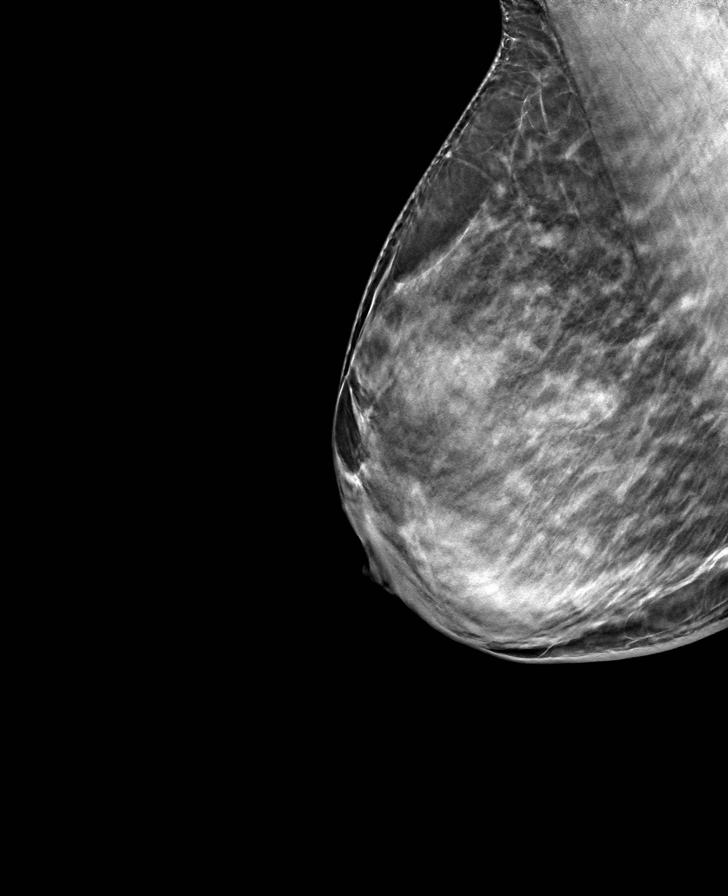

[R CC tomo · tomo slice 29/58.0]
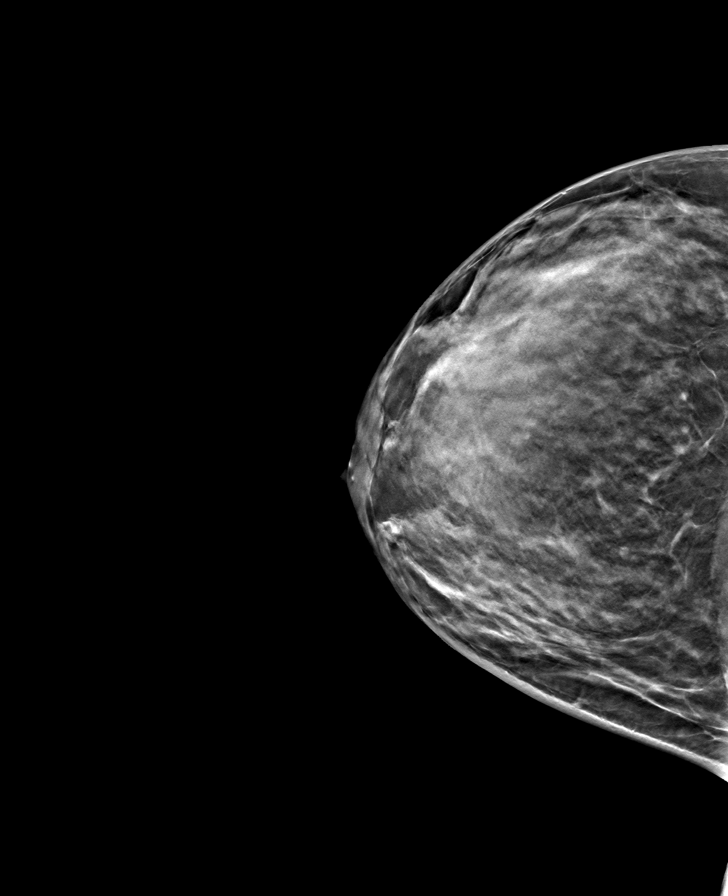

[L MLO tomo · tomo slice 31/62.0]
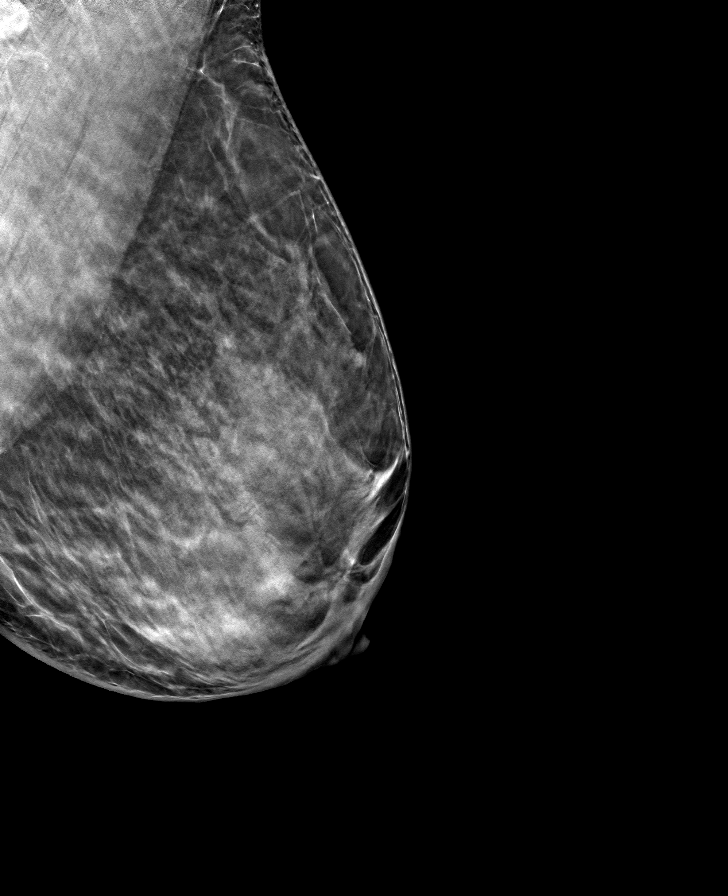

[L CC tomo · tomo slice 27/54.0]
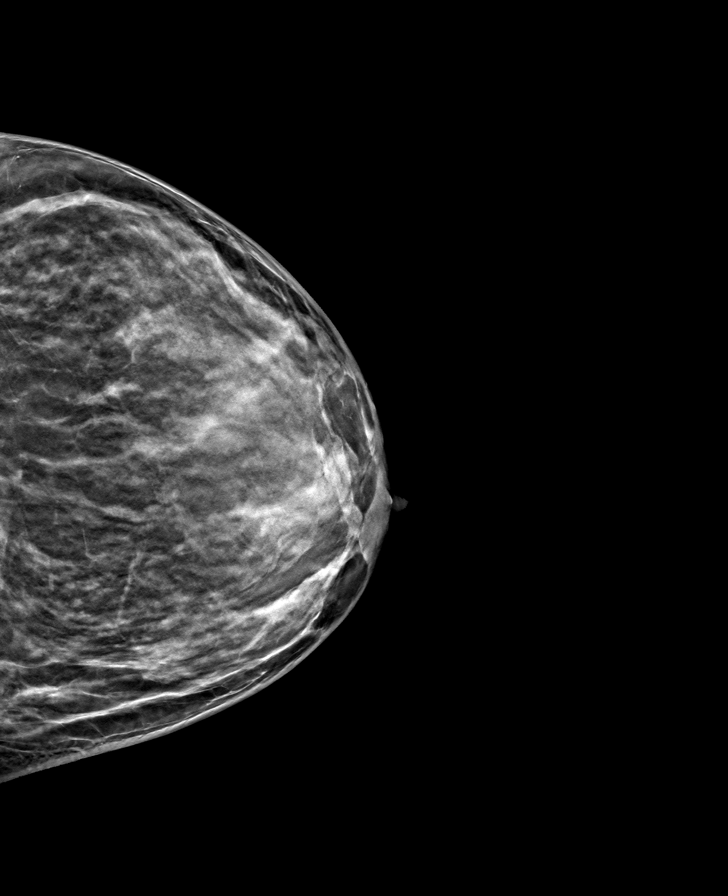

[8 of 24 positions shown; findings below may reference images not displayed]

ACR Breast Density Category c: The breast tissue is heterogeneously
dense, which may obscure small masses
FINDINGS: There are no findings suspicious for malignancy. Images were
processed with CAD.
IMPRESSION: No mammographic evidence of malignancy. A result letter of this
screening mammogram will be mailed directly to the patient.

RECOMMENDATION:
Screening mammogram in one year. (Code:EM-[id number])

BI-RADS CATEGORY  1: Negative.

## 2023-10-03 ENCOUNTER — Other Ambulatory Visit: Payer: Self-pay | Admitting: Medical Genetics

## 2023-12-24 ENCOUNTER — Other Ambulatory Visit: Payer: Self-pay | Admitting: Medical Genetics

## 2023-12-24 DIAGNOSIS — Z006 Encounter for examination for normal comparison and control in clinical research program: Secondary | ICD-10-CM

## 2024-02-03 LAB — GENECONNECT MOLECULAR SCREEN: Genetic Analysis Overall Interpretation: NEGATIVE
